# Patient Record
Sex: Male | Born: 1960 | Race: White | Hispanic: No | Marital: Married | Smoking: Never smoker
Health system: Southern US, Community
[De-identification: ages and names within clinical notes are randomized; demographics above are authoritative.]

## PROBLEM LIST (undated history)

## (undated) DIAGNOSIS — S63599A Other specified sprain of unspecified wrist, initial encounter: Secondary | ICD-10-CM

## (undated) DIAGNOSIS — E785 Hyperlipidemia, unspecified: Secondary | ICD-10-CM

## (undated) DIAGNOSIS — R7303 Prediabetes: Secondary | ICD-10-CM

## (undated) DIAGNOSIS — M545 Low back pain, unspecified: Secondary | ICD-10-CM

## (undated) DIAGNOSIS — K8020 Calculus of gallbladder without cholecystitis without obstruction: Secondary | ICD-10-CM

## (undated) DIAGNOSIS — M1990 Unspecified osteoarthritis, unspecified site: Secondary | ICD-10-CM

## (undated) DIAGNOSIS — C4491 Basal cell carcinoma of skin, unspecified: Secondary | ICD-10-CM

## (undated) DIAGNOSIS — M25511 Pain in right shoulder: Secondary | ICD-10-CM

## (undated) DIAGNOSIS — D126 Benign neoplasm of colon, unspecified: Secondary | ICD-10-CM

## (undated) DIAGNOSIS — M25512 Pain in left shoulder: Secondary | ICD-10-CM

## (undated) DIAGNOSIS — M65831 Other synovitis and tenosynovitis, right forearm: Secondary | ICD-10-CM

## (undated) DIAGNOSIS — N182 Chronic kidney disease, stage 2 (mild): Secondary | ICD-10-CM

## (undated) DIAGNOSIS — K5790 Diverticulosis of intestine, part unspecified, without perforation or abscess without bleeding: Secondary | ICD-10-CM

## (undated) HISTORY — DX: Low back pain, unspecified: M54.50

## (undated) HISTORY — DX: Benign neoplasm of colon, unspecified: D12.6

## (undated) HISTORY — DX: Basal cell carcinoma of skin, unspecified: C44.91

## (undated) HISTORY — DX: Unspecified osteoarthritis, unspecified site: M19.90

## (undated) HISTORY — DX: Pain in right shoulder: M25.511

## (undated) HISTORY — PX: OTHER SURGICAL HISTORY: SHX169

## (undated) HISTORY — DX: Hyperlipidemia, unspecified: E78.5

## (undated) HISTORY — DX: Low back pain: M54.5

## (undated) HISTORY — DX: Diverticulosis of intestine, part unspecified, without perforation or abscess without bleeding: K57.90

## (undated) HISTORY — DX: Pain in left shoulder: M25.512

## (undated) HISTORY — DX: Other specified sprain of unspecified wrist, initial encounter: S63.599A

## (undated) HISTORY — PX: BASAL CELL CARCINOMA EXCISION: SHX1214

## (undated) HISTORY — PX: SPINE SURGERY: SHX786

## (undated) HISTORY — DX: Chronic kidney disease, stage 2 (mild): N18.2

## (undated) HISTORY — DX: Calculus of gallbladder without cholecystitis without obstruction: K80.20

## (undated) HISTORY — DX: Other synovitis and tenosynovitis, right forearm: M65.831

## (undated) HISTORY — DX: Prediabetes: R73.03

## (undated) HISTORY — PX: COLONOSCOPY W/ POLYPECTOMY: SHX1380

---

## 2008-06-13 ENCOUNTER — Encounter: Payer: Self-pay | Admitting: Family Medicine

## 2009-08-25 ENCOUNTER — Encounter: Payer: Self-pay | Admitting: Family Medicine

## 2009-08-26 ENCOUNTER — Encounter: Payer: Self-pay | Admitting: Family Medicine

## 2009-10-28 ENCOUNTER — Encounter: Payer: Self-pay | Admitting: Family Medicine

## 2009-10-28 LAB — HM COLONOSCOPY

## 2009-12-02 ENCOUNTER — Encounter: Admission: RE | Admit: 2009-12-02 | Discharge: 2009-12-02 | Payer: Self-pay | Admitting: Orthopedic Surgery

## 2009-12-25 ENCOUNTER — Encounter: Admission: RE | Admit: 2009-12-25 | Discharge: 2009-12-25 | Payer: Self-pay | Admitting: Orthopedic Surgery

## 2010-03-04 HISTORY — PX: LUMBAR DISC SURGERY: SHX700

## 2010-04-04 DIAGNOSIS — E785 Hyperlipidemia, unspecified: Secondary | ICD-10-CM

## 2010-04-04 HISTORY — DX: Hyperlipidemia, unspecified: E78.5

## 2010-04-26 ENCOUNTER — Encounter: Payer: Self-pay | Admitting: Family Medicine

## 2010-05-06 ENCOUNTER — Ambulatory Visit: Payer: Self-pay | Admitting: Family Medicine

## 2010-05-10 ENCOUNTER — Encounter: Payer: Self-pay | Admitting: Family Medicine

## 2010-05-11 ENCOUNTER — Encounter: Payer: Self-pay | Admitting: Family Medicine

## 2010-05-20 NOTE — Miscellaneous (Signed)
  Clinical Lists Changes  Observations: Added new observation of SOCIAL HX: Occupaton: regional Advice worker for [employer] (05/11/2010 12:00) Added new observation of PAST SURG HX: L4-5 discectomy 2011 Skin: basal cell removed (05/11/2010 12:00) Added new observation of PAST MED HX: Low back pain Hyperlipidemia Basal cell carcinoma (05/11/2010 12:00)       Past History:  Past Medical History: Low back pain Hyperlipidemia Basal cell carcinoma  Past Surgical History: L4-5 discectomy 2011 Skin: basal cell removed   Social History: Occupaton: Transport planner for [employer]

## 2010-05-28 ENCOUNTER — Encounter: Payer: Self-pay | Admitting: Family Medicine

## 2010-05-28 ENCOUNTER — Telehealth: Payer: Self-pay | Admitting: Family Medicine

## 2010-05-28 ENCOUNTER — Ambulatory Visit (INDEPENDENT_AMBULATORY_CARE_PROVIDER_SITE_OTHER): Payer: Self-pay | Admitting: Family Medicine

## 2010-05-28 DIAGNOSIS — E785 Hyperlipidemia, unspecified: Secondary | ICD-10-CM | POA: Insufficient documentation

## 2010-05-28 DIAGNOSIS — Z23 Encounter for immunization: Secondary | ICD-10-CM

## 2010-05-31 ENCOUNTER — Encounter: Payer: Self-pay | Admitting: Family Medicine

## 2010-06-01 NOTE — Assessment & Plan Note (Signed)
Summary: cpx new patient/vfw   Vital Signs:  Patient profile:   50 year old male Height:      74.5 inches Weight:      212.25 pounds BMI:     26.98 O2 Sat:      97 % on Room air Pulse rate:   64 / minute BP sitting:   118 / 79  (right arm) Cuff size:   large  Vitals Entered By: Francee Piccolo CMA Duncan Dull) (May 28, 2010 1:21 PM)  O2 Flow:  Room air CC: Establish care, review records///SP Is Patient Diabetic? No   History of Present Illness: 50 y/o WM here to establish care. No acute complaints.  He gets annual CPE's with EKG, hemoccults, fasting lab panel, ext. with his employer, [employer]. His most recent labs showed mild hyperlipidemia (Tchol 240, LDL 157, HDL 49, trig 172).  This LDL was up 27 points from 0981 labs.  Of note, in 2010 he was suffering from low back pain and was not able to swim like he usually does.  He then had lumbar spine surgery in 03/2009 and has just now started swimming again.  Diet in the last year or so has also had lots more red meat than prior years. Denies CP, SOB, palpitations. Care Coordination Dermatologist: Dr. Donzetta Starch Gastroenterologist: Dr. Loreta Ave Neurosurgeon: Dr. Jeral Fruit [facility])  Current Medications (verified): 1)  Multivitamins  Tabs (Multiple Vitamin) .... Take 1 Tablet By Mouth Once A Day 2)  Co Q-10 100 Mg Caps (Coenzyme Q10) .... Take 1 Capsule By Mouth Once A Day 3)  Red Yeast Rice 600 Mg Tabs (Red Yeast Rice Extract) .... Take 1 Tablet By Mouth Once A Day  Allergies (verified): No Known Drug Allergies  Past History:  Past Medical History: Low back pain Hyperlipidemia Basal cell carcinoma Colon polyps (benign): repeat planned for 2016   Past Surgical History: L4-5 discectomy 03/2010 Skin: basal cell removed  Family History: Dad: hyperlipidemia (no CAD). Mom: d. age 63 of colon cancer. 3 sisters-healthy 1 sister d. age 36 yrs of kidney failure.  Social History: Married, 3 grown children. Occupaton: Conservator, museum/gallery for [employer]. No T/A/Ds.   Competetive swimmer for many years, still swims as his primary form of exercise.   Originally from [place] area, big [employer].  Review of Systems  The patient denies fever, weight loss, weight gain, vision loss, decreased hearing, hoarseness, chest pain, syncope, dyspnea on exertion, peripheral edema, prolonged cough, headaches, hemoptysis, abdominal pain, melena, hematochezia, severe indigestion/heartburn, hematuria, incontinence, genital sores, muscle weakness, suspicious skin lesions, transient blindness, difficulty walking, depression, unusual weight change, abnormal bleeding, enlarged lymph nodes, angioedema, breast masses, and testicular masses.    Physical Exam  General:  VS noted, all normal.  Pleasant, well appearing. No further exam today.   Impression & Recommendations:  Problem # 1:  HYPERLIPIDEMIA (ICD-272.4) Assessment New Spent 20 minutes with patient today, with >50% of this time spent in education and care coordination regarding hyperlipidemia. Goal LDL 130.   He is restarting his prior lifestyle of frequent swimming and lower fat/chol intake and he wants to avoid med if possible. Will recheck chol panel in 6 mo to see if lifestyle changes alone result in enough chol reduction. Tdap given today.  Recommend routine Td in 10 yrs. He wants to get flu vaccine through his employer. We'll get records from his GI MD. He gets annual CPE, etc through his employer so I told him to come see me whenever he  has questions or problems.  Complete Medication List: 1)  Multivitamins Tabs (Multiple vitamin) .... Take 1 tablet by mouth once a day 2)  Co Q-10 100 Mg Caps (Coenzyme q10) .... Take 1 capsule by mouth once a day 3)  Red Yeast Rice 600 Mg Tabs (Red yeast rice extract) .... Take 1 tablet by mouth once a day  Patient Instructions: 1)  Return in 60mo for labs only (fasting lipid panel, dx is 272.4). 2)  Return for office visit  with me at any time for problems.   Orders Added: 1)  New Patient Level II [99202]   Immunizations Administered:  Tetanus Vaccine:    Vaccine Type: Tdap    Site: left deltoid    Mfr: GlaxoSmithKline    Dose: 0.5 ml    Route: IM    Given by: Francee Piccolo CMA (AAMA)    Exp. Date: 01/22/2012    Lot #: [id number]    VIS given: 02/20/08 version given May 28, 2010.

## 2010-06-10 NOTE — Progress Notes (Signed)
Summary: Release of medical records letter and form mailed  Phone Note Other Incoming   Summary of Call: Pls request records from Dr. Loreta Ave (GI).  Thx Initial call taken by: Michell Heinrich M.D.,  May 28, 2010 2:10 PM  Follow-up for Phone Call        Mailed release of information form and letter asking patient to fill out and return.  Follow-up by: Georga Bora,  June 01, 2010 1:09 PM

## 2010-06-10 NOTE — Miscellaneous (Signed)
  Clinical Lists Changes  Observations: Added new observation of GASTROENT MD: Dr. Charna Elizabeth (05/31/2010 9:24) Added new observation of PAST MED HX: Low back pain Hyperlipidemia Basal cell carcinoma Colon polyps (adenomatous) and diverticulosis: repeat planned for 2016  (05/31/2010 9:24) Added new observation of PRIMARY MD: Nicoletta Ba, MD (05/31/2010 9:24) Added new observation of COLONOSCOPY: Adenomatous Polyp (10/28/2009 9:26)        Preventive Care Screening  Colonoscopy:    Date:  10/28/2009    Results:  Adenomatous Polyp   Past History:  Past Medical History: Low back pain Hyperlipidemia Basal cell carcinoma Colon polyps (adenomatous) and diverticulosis: repeat planned for 2016    Care Coordination Gastroenterologist: Dr. Charna Elizabeth

## 2010-06-15 NOTE — Procedures (Signed)
Summary: Colonoscopy Report/[facility]  Colonoscopy Report/[facility]   Imported By: Maryln Gottron 06/07/2010 13:21:39  _____________________________________________________________________  External Attachment:    Type:   Image     Comment:   External Document

## 2010-06-15 NOTE — Letter (Signed)
Summary: [facility]  [facility]   Imported By: Maryln Gottron 06/07/2010 13:24:49  _____________________________________________________________________  External Attachment:    Type:   Image     Comment:   External Document

## 2010-06-15 NOTE — Letter (Signed)
Summary: Health Services Physical/[facility]  Health Services Physical/[facility]   Imported By: Maryln Gottron 06/07/2010 13:16:43  _____________________________________________________________________  External Attachment:    Type:   Image     Comment:   External Document

## 2010-06-25 ENCOUNTER — Ambulatory Visit: Payer: Self-pay | Admitting: Family Medicine

## 2010-08-13 ENCOUNTER — Encounter: Payer: Self-pay | Admitting: Family Medicine

## 2010-10-04 ENCOUNTER — Encounter: Payer: Self-pay | Admitting: Family Medicine

## 2010-10-04 ENCOUNTER — Other Ambulatory Visit (INDEPENDENT_AMBULATORY_CARE_PROVIDER_SITE_OTHER): Payer: BC Managed Care – PPO

## 2010-10-04 ENCOUNTER — Ambulatory Visit: Payer: BC Managed Care – PPO | Admitting: Family Medicine

## 2010-10-04 DIAGNOSIS — E785 Hyperlipidemia, unspecified: Secondary | ICD-10-CM

## 2010-10-04 LAB — LIPID PANEL
Total CHOL/HDL Ratio: 4
Triglycerides: 93 mg/dL (ref 0.0–149.0)
VLDL: 18.6 mg/dL (ref 0.0–40.0)

## 2012-02-20 ENCOUNTER — Encounter: Payer: Self-pay | Admitting: Family Medicine

## 2012-02-20 ENCOUNTER — Ambulatory Visit (INDEPENDENT_AMBULATORY_CARE_PROVIDER_SITE_OTHER): Payer: BC Managed Care – PPO | Admitting: Family Medicine

## 2012-02-20 VITALS — BP 110/66 | HR 53 | Ht 74.5 in | Wt 211.0 lb

## 2012-02-20 DIAGNOSIS — E785 Hyperlipidemia, unspecified: Secondary | ICD-10-CM

## 2012-02-20 MED ORDER — ATORVASTATIN CALCIUM 10 MG PO TABS: 10.0000 mg | ORAL_TABLET | Freq: Every day | ORAL | Status: DC

## 2012-02-20 NOTE — Progress Notes (Signed)
OFFICE NOTE  02/20/2012  CC:  Chief Complaint  Patient presents with  . Follow-up    hyperlipidemia; elevated labs on work physical     HPI: Patient is a 51 y.o. Caucasian male who is here for discussion of recent blood tests he had via his employer: showed elevated Tchol and LDL as well as elevated LDL-P and elevated small LDL-P.   Serum chemistries were normal except mildly elevated potassium (5.3).  Thyroid panel normal, CBC w/diff normal, UA normal. He is very active-swims 4 days per week and also eats an excellent low fat/low chol diet. He is moving to [place] for 3 yrs for his employer and is interested in discussing how to get started on treatment for his high cholesterol and how to do f/u since he is moving out of the country.  He'll be moving in the first week of January 2014. Currently has no physical complaints.   Pertinent PMH:  Past Medical History  Diagnosis Date  . Low back pain   . Hyperlipidemia 2012    Diet/exercise x 33mo improved this.  . Basal cell carcinoma   . Adenomatous colon polyp   . Diverticulosis    Past Surgical History  Procedure Date  . Lumbar disc surgery 03/2010    L4-5  . Basal cell carcinoma excision     MEDS:  Outpatient Prescriptions Prior to Visit  Medication Sig Dispense Refill  . Coenzyme Q10 (CO Q 10) 100 MG CAPS Take 1 capsule by mouth daily.        . Multiple Vitamin (MULTIVITAMIN) tablet Take 1 tablet by mouth daily.        . Red Yeast Rice 600 MG TABS Take 1 tablet by mouth daily.         Last reviewed on 02/20/2012  3:54 PM by Jeoffrey Massed, MD  PE: Blood pressure 110/66, pulse 53, height 6' 2.5" (1.892 m), weight 211 lb (95.709 kg). Gen: Alert, well appearing.  Patient is oriented to person, place, time, and situation. AFFECT: pleasant, lucid thought and speech. CV: RRR, no m/r/g.   LUNGS: CTA bilat, nonlabored resps, good aeration in all lung fields. ABD: soft, NT, ND, BS normal.  No hepatospenomegaly or mass.   No bruits. EXT: no clubbing, cyanosis, or edema.   LAB: per [facility] on 01/31/12: Tchol 245, LDL-C 170, HDL-C 55, trigs 100, LDL particle number 1926, small LDL-P 1093, LDL size 20.3, and HDL-P 32.  IMPRESSION AND PLAN:  HYPERLIPIDEMIA He is overall a low risk CV profile, but his lipid numbers and advanced lipoprotein particle testing is high enough that I would recommend he start a statin and low dose aspirin ( 81mg  qd). Discussed in detail with pt today and decided to start atorvastatin 10mg  qd.  Due to his moving to [place] in about 6 wks, I wrote down the labs for him to have repeated in 50mo on a paper rx today and gave it to him (FLP, hepatic panel, LDL-P number and size, small LDL-P number, and HDL particle number).  He'll have labwork availability through [place] while in [place], and I encouraged him to go ahead and find a primary care MD there ASAP so he can be followed in the office periodically. Therapeutic expectations and side effect profile of medication discussed today.  Patient's questions answered. He'll be staying in [place] a minimum of 3 yrs but may be returning here annually for CPE with me.    An After Visit Summary was printed and given  to the patient.

## 2012-02-20 NOTE — Assessment & Plan Note (Signed)
He is overall a low risk CV profile, but his lipid numbers and advanced lipoprotein particle testing is high enough that I would recommend he start a statin and low dose aspirin ( 81mg  qd). Discussed in detail with pt today and decided to start atorvastatin 10mg  qd.  Due to his moving to [place] in about 6 wks, I wrote down the labs for him to have repeated in 21mo on a paper rx today and gave it to him (FLP, hepatic panel, LDL-P number and size, small LDL-P number, and HDL particle number).  He'll have labwork availability through [place] while in [place], and I encouraged him to go ahead and find a primary care MD there ASAP so he can be followed in the office periodically. Therapeutic expectations and side effect profile of medication discussed today.  Patient's questions answered. He'll be staying in [place] a minimum of 3 yrs but may be returning here annually for CPE with me.

## 2012-10-12 ENCOUNTER — Telehealth: Payer: Self-pay | Admitting: Emergency Medicine

## 2012-10-12 MED ORDER — ATORVASTATIN CALCIUM 10 MG PO TABS: 10.0000 mg | ORAL_TABLET | Freq: Every day | ORAL | Status: DC

## 2012-10-12 NOTE — Telephone Encounter (Signed)
Patient needs a refill on Atovastation 10 mg, he has relocated out of the country but is [place] the states right now. He has an [employer] cell phone [phone] that he can be reached at. He will not be back until November and has made an appointment for follow up on medication. He would like to see if he can get enough medication until he comes back. [facility] [place] [facility] number is [phone] this is where he would like the prescription sent.

## 2012-10-12 NOTE — Telephone Encounter (Signed)
Medications [facility] [place]  Thomas Hubbard is contacting patient to let him know that he will not get any refills from [place] until he is seen in the office.

## 2012-10-12 NOTE — Telephone Encounter (Signed)
OK to RF until the time of his next appt in November. No RF's beyond this point--make sure he knows that.-thx

## 2012-10-12 NOTE — Telephone Encounter (Signed)
Last OV was 02/20/12, follow up schedule 02/18/13.  No follow up noted in chart that I saw.  Please advise refill.

## 2013-02-18 ENCOUNTER — Ambulatory Visit: Payer: BC Managed Care – PPO | Admitting: Family Medicine

## 2013-02-25 ENCOUNTER — Ambulatory Visit: Payer: BC Managed Care – PPO | Admitting: Family Medicine

## 2013-02-26 ENCOUNTER — Encounter: Payer: Self-pay | Admitting: Family Medicine

## 2013-02-26 ENCOUNTER — Ambulatory Visit (INDEPENDENT_AMBULATORY_CARE_PROVIDER_SITE_OTHER): Payer: 59 | Admitting: Family Medicine

## 2013-02-26 VITALS — BP 112/76 | HR 60 | Temp 97.8°F | Resp 18 | Ht 74.5 in | Wt 205.0 lb

## 2013-02-26 DIAGNOSIS — E785 Hyperlipidemia, unspecified: Secondary | ICD-10-CM

## 2013-02-26 MED ORDER — ATORVASTATIN CALCIUM 10 MG PO TABS: 10.0000 mg | ORAL_TABLET | Freq: Every day | ORAL | Status: DC

## 2013-02-26 NOTE — Progress Notes (Signed)
Pre visit review using our clinic review tool, if applicable. No additional management support is needed unless otherwise documented below in the visit note.  OFFICE NOTE  02/26/2013  CC:  Chief Complaint  Patient presents with  . Follow-up     HPI: Patient is a 52 y.o. Caucasian male who is here for f/u hyperlipidemia. I started him on atorv about a year ago and he has been taking it but has been living in [place]. He reports that he feels well and is having no problems taking his atorv daily. He says he got a CPE with routine blood testing in March this year (in [place]) and he says all labs were normal.  He does not have any records with him today.  Pertinent PMH:  Past Medical History  Diagnosis Date  . Low back pain   . Hyperlipidemia 2012    Diet/exercise x 87mo improved this.  . Basal cell carcinoma   . Adenomatous colon polyp   . Diverticulosis    Past Surgical History  Procedure Laterality Date  . Lumbar disc surgery  03/2010    L4-5  . Basal cell carcinoma excision       MEDS:  Outpatient Prescriptions Prior to Visit  Medication Sig Dispense Refill  . atorvastatin (LIPITOR) 10 MG tablet Take 1 tablet (10 mg total) by mouth daily.  90 tablet  1  . Coenzyme Q10 (CO Q 10) 100 MG CAPS Take 1 capsule by mouth daily.        . Multiple Vitamin (MULTIVITAMIN) tablet Take 1 tablet by mouth daily.        Marland Kitchen NAFTIN 2 % CREA       . Red Yeast Rice 600 MG TABS Take 1 tablet by mouth daily.         No facility-administered medications prior to visit.    PE: Blood pressure 112/76, pulse 60, temperature 97.8 F (36.6 C), temperature source Temporal, resp. rate 18, height 6' 2.5" (1.892 m), weight 205 lb (92.987 kg), SpO2 98.00%. Gen: Alert, well appearing.  Patient is oriented to person, place, time, and situation. AFFECT: pleasant, lucid thought and speech. No further exam today.  IMPRESSION AND PLAN:  HYPERLIPIDEMIA Stable. Tolerating statin fine. He  will try to get me a copy of his labs that were done in [place] in March this year. I told him annual lipid panel with liver panel are recommended.  He is not due for these today so no blood testing was done today. He'll be returning to [place] tonight and will come back for a return visit in 1 yr. He has plans to live in [place] for the next 2 yrs, then move back to [facility].   An After Visit Summary was printed and given to the patient.  FOLLOW UP: 1 yr

## 2013-02-26 NOTE — Assessment & Plan Note (Signed)
Stable. Tolerating statin fine. He will try to get me a copy of his labs that were done in [place] in March this year. I told him annual lipid panel with liver panel are recommended.  He is not due for these today so no blood testing was done today. He'll be returning to [place] tonight and will come back for a return visit in 1 yr. He has plans to live in [place] for the next 2 yrs, then move back to [facility].

## 2014-03-25 ENCOUNTER — Encounter: Payer: Self-pay | Admitting: Family Medicine

## 2014-03-25 ENCOUNTER — Ambulatory Visit (INDEPENDENT_AMBULATORY_CARE_PROVIDER_SITE_OTHER): Payer: 59 | Admitting: Family Medicine

## 2014-03-25 VITALS — BP 143/89 | HR 50 | Temp 97.7°F | Resp 18 | Ht 74.5 in | Wt 208.0 lb

## 2014-03-25 DIAGNOSIS — E785 Hyperlipidemia, unspecified: Secondary | ICD-10-CM

## 2014-03-25 LAB — COMPREHENSIVE METABOLIC PANEL
ALT: 17 U/L (ref 0–53)
AST: 18 U/L (ref 0–37)
Albumin: 4 g/dL (ref 3.5–5.2)
Alkaline Phosphatase: 51 U/L (ref 39–117)
BILIRUBIN TOTAL: 1.2 mg/dL (ref 0.2–1.2)
BUN: 18 mg/dL (ref 6–23)
CO2: 26 mEq/L (ref 19–32)
CREATININE: 1.1 mg/dL (ref 0.4–1.5)
Calcium: 9.1 mg/dL (ref 8.4–10.5)
Chloride: 106 mEq/L (ref 96–112)
GFR: 73.13 mL/min (ref >60.00)
GLUCOSE: 101 mg/dL — AB (ref 70–99)
Potassium: 4.3 mEq/L (ref 3.5–5.1)
Sodium: 139 mEq/L (ref 135–145)
Total Protein: 6.7 g/dL (ref 6.0–8.3)

## 2014-03-25 LAB — LIPID PANEL
Cholesterol: 162 mg/dL (ref 0–200)
HDL: 45.4 mg/dL (ref >39.00)
LDL CALC: 106 mg/dL — AB (ref 0–99)
NONHDL: 116.6
Total CHOL/HDL Ratio: 4
Triglycerides: 54 mg/dL (ref 0.0–149.0)
VLDL: 10.8 mg/dL (ref 0.0–40.0)

## 2014-03-25 MED ORDER — ATORVASTATIN CALCIUM 10 MG PO TABS: 10.0000 mg | ORAL_TABLET | Freq: Every day | ORAL | Status: DC

## 2014-03-25 NOTE — Addendum Note (Signed)
Addended by: Tammi Sou on: 03/25/2014 08:29 AM   Modules accepted: Orders

## 2014-03-25 NOTE — Progress Notes (Signed)
OFFICE NOTE  03/25/2014  CC:  Chief Complaint  Patient presents with  . Hyperlipidemia    fasting   HPI: Patient is a 53 y.o. Caucasian male who is here for 1 yr f/u hyperlipidemia.  Taking atorva w/out problem. Living in [place] still, doing a lot of business travel, not as much exercise as usual. Dies not as good as usual. No acute complaints.   Pertinent PMH:  Past medical, surgical, social, and family history reviewed and no changes are noted since last office visit.  MEDS:  Outpatient Prescriptions Prior to Visit  Medication Sig Dispense Refill  . atorvastatin (LIPITOR) 10 MG tablet Take 1 tablet (10 mg total) by mouth daily. 90 tablet 4  . Coenzyme Q10 (CO Q 10) 100 MG CAPS Take 1 capsule by mouth daily.      . Multiple Vitamin (MULTIVITAMIN) tablet Take 1 tablet by mouth daily.      Marland Kitchen NAFTIN 2 % CREA     . Red Yeast Rice 600 MG TABS Take 1 tablet by mouth daily.       No facility-administered medications prior to visit.    PE: Blood pressure 143/89, pulse 50, temperature 97.7 F (36.5 C), temperature source Temporal, resp. rate 18, height 6' 2.5" (1.892 m), weight 208 lb (94.348 kg), SpO2 98 %. Gen: Alert, well appearing.  Patient is oriented to person, place, time, and situation. No further exam today.  IMPRESSION AND PLAN: Hyperlipidemia: recheck FLP and CMET today. He'll be moving back to [place] permanently in the near future so he'll likely make appt for CPE pretty soon after. He is UTD on flu vaccine.  An After Visit Summary was printed and given to the patient.  FOLLOW UP: prn

## 2014-03-25 NOTE — Progress Notes (Signed)
Pre visit review using our clinic review tool, if applicable. No additional management support is needed unless otherwise documented below in the visit note. 

## 2014-03-26 ENCOUNTER — Encounter: Payer: Self-pay | Admitting: Family Medicine

## 2015-03-26 ENCOUNTER — Ambulatory Visit: Payer: 59 | Admitting: Family Medicine

## 2015-03-31 ENCOUNTER — Encounter: Payer: Self-pay | Admitting: Family Medicine

## 2015-03-31 ENCOUNTER — Ambulatory Visit (INDEPENDENT_AMBULATORY_CARE_PROVIDER_SITE_OTHER): Payer: 59 | Admitting: Family Medicine

## 2015-03-31 VITALS — BP 117/76 | HR 48 | Temp 97.4°F | Resp 16 | Ht 74.5 in | Wt 212.5 lb

## 2015-03-31 DIAGNOSIS — E785 Hyperlipidemia, unspecified: Secondary | ICD-10-CM

## 2015-03-31 LAB — ALT: ALT: 17 U/L (ref 0–53)

## 2015-03-31 LAB — LIPID PANEL
CHOLESTEROL: 167 mg/dL (ref 0–200)
HDL: 48.1 mg/dL (ref >39.00)
LDL Cholesterol: 101 mg/dL — ABNORMAL HIGH (ref 0–99)
NonHDL: 118.99
TRIGLYCERIDES: 88 mg/dL (ref 0.0–149.0)
Total CHOL/HDL Ratio: 3
VLDL: 17.6 mg/dL (ref 0.0–40.0)

## 2015-03-31 LAB — AST: AST: 18 U/L (ref 0–37)

## 2015-03-31 MED ORDER — ATORVASTATIN CALCIUM 10 MG PO TABS: 10.0000 mg | ORAL_TABLET | Freq: Every day | ORAL | Status: DC

## 2015-03-31 NOTE — Progress Notes (Signed)
OFFICE VISIT  03/31/2015   CC:  Chief Complaint  Patient presents with  . Follow-up    HCL, pt is fasting.    HPI:    Patient is a 54 y.o. Caucasian male who presents for 1 yr f/u hyperlipidemia.  Will be living in [place] for one more year then will be living back in [place] permanently.  Tolerating statin fine, compliant with med.  Does his best as far as low carb/low fat diet. Has occ glass of red wine.  Exercise: swims and runs, lots of walking.      Past Medical History  Diagnosis Date  . Low back pain   . Hyperlipidemia 2012    Diet/exercise x 4mo improved this.  . Basal cell carcinoma   . Adenomatous colon polyp 2011  . Diverticulosis     Past Surgical History  Procedure Laterality Date  . Lumbar disc surgery  03/2010    L4-5  . Basal cell carcinoma excision    . Colonoscopy w/ polypectomy  10/2009    1 polyp, recall 5 yrs    Outpatient Prescriptions Prior to Visit  Medication Sig Dispense Refill  . atorvastatin (LIPITOR) 10 MG tablet Take 1 tablet (10 mg total) by mouth daily. 90 tablet 4   No facility-administered medications prior to visit.    No Known Allergies  ROS As per HPI  PE: Blood pressure 117/76, pulse 48, temperature 97.4 F (36.3 C), temperature source Oral, resp. rate 16, height 6' 2.5" (1.892 m), weight 212 lb 8 oz (96.389 kg), SpO2 98 %. Gen: Alert, well appearing.  Patient is oriented to person, place, time, and situation. CV: RRR, no m/r/g.   LUNGS: CTA bilat, nonlabored resps, good aeration in all lung fields. EXT: no clubbing, cyanosis, or edema.    LABS:  Lab Results  Component Value Date   CHOL 162 03/25/2014   HDL 45.40 03/25/2014   LDLCALC 106* 03/25/2014   TRIG 54.0 03/25/2014   CHOLHDL 4 03/25/2014    IMPRESSION AND PLAN:  Hyperlipidemia: The current medical regimen is effective;  continue present plan and medications. Recheck FLP and AST/ALT today.  Refilled meds. He is still living in [place] for  the next 1 yr, then will be living permanently back in [place].  An After Visit Summary was printed and given to the patient.  FOLLOW UP: Return in about 1 year (around 03/30/2016) for annual CPE (fasting).

## 2015-03-31 NOTE — Progress Notes (Signed)
Pre visit review using our clinic review tool, if applicable. No additional management support is needed unless otherwise documented below in the visit note. 

## 2015-04-03 ENCOUNTER — Encounter: Payer: Self-pay | Admitting: Family Medicine

## 2015-06-23 ENCOUNTER — Other Ambulatory Visit: Payer: Self-pay | Admitting: *Deleted

## 2015-06-23 MED ORDER — ATORVASTATIN CALCIUM 10 MG PO TABS: 10.0000 mg | ORAL_TABLET | Freq: Every day | ORAL | Status: DC

## 2015-06-23 NOTE — Telephone Encounter (Signed)
RF request for atorvastatin LOV: 03/31/15 Next ov: 03/30/16 Last written: 03/31/15 #90 w/ 4RF

## 2016-02-03 DIAGNOSIS — M25512 Pain in left shoulder: Secondary | ICD-10-CM

## 2016-02-03 HISTORY — DX: Pain in left shoulder: M25.512

## 2016-02-04 ENCOUNTER — Encounter: Payer: Self-pay | Admitting: Family Medicine

## 2016-02-04 ENCOUNTER — Ambulatory Visit (INDEPENDENT_AMBULATORY_CARE_PROVIDER_SITE_OTHER): Payer: 59 | Admitting: Family Medicine

## 2016-02-04 VITALS — BP 132/88 | HR 52 | Temp 98.6°F | Resp 16 | Wt 219.1 lb

## 2016-02-04 DIAGNOSIS — G8929 Other chronic pain: Secondary | ICD-10-CM

## 2016-02-04 DIAGNOSIS — M791 Myalgia: Secondary | ICD-10-CM

## 2016-02-04 DIAGNOSIS — M25512 Pain in left shoulder: Secondary | ICD-10-CM

## 2016-02-04 DIAGNOSIS — M7918 Myalgia, other site: Secondary | ICD-10-CM

## 2016-02-04 NOTE — Progress Notes (Signed)
OFFICE VISIT  02/04/2016   CC:  Chief Complaint  Patient presents with  . Shoulder Pain    Left shoulder and upper arm pain   HPI:    Patient is a 55 y.o. Caucasian male who presents for left shoulder and left upper arm pain. He swims for exercise, noted that the pain started about 2 mo ago.  He used to do all 4 strokes but cannot do butterfly or breast stroke now. He feels the pain in left deltoid, primarly posterior aspect.  Hurts if he sleeps on it and with aBduction and ER of left shoulder.  No radicular pain or paresthesias.   Aleve 1-2 tabs per day and this helps a little bit at most.  No ice or heat.  No hx of L shoulder prob in past. He was formerly a Academic librarian competitively in college.  Past Medical History:  Diagnosis Date  . Adenomatous colon polyp 2011; 03/2015  . Basal cell carcinoma   . Diverticulosis   . Hyperlipidemia 2012   Diet/exercise x 24mo improved this.  . Low back pain     Past Surgical History:  Procedure Laterality Date  . BASAL CELL CARCINOMA EXCISION    . COLONOSCOPY W/ POLYPECTOMY  10/2009; 03/2015   2011 had 1 polyp, 2016 had 2 polyps: recall 5 yrs  . LUMBAR DISC SURGERY  03/2010   L4-5    Outpatient Medications Prior to Visit  Medication Sig Dispense Refill  . atorvastatin (LIPITOR) 10 MG tablet Take 1 tablet (10 mg total) by mouth daily. 90 tablet 3   No facility-administered medications prior to visit.     No Known Allergies  ROS As per HPI  PE: Blood pressure 132/88, pulse (!) 52, temperature 98.6 F (37 C), temperature source Temporal, resp. rate 16, weight 219 lb 1.9 oz (99.4 kg), SpO2 96 %. Gen: Alert, well appearing.  Patient is oriented to person, place, time, and situation. AFFECT: pleasant, lucid thought and speech. Left shoulder: no tenderness of shoulder girdle.  He has point tenderness in posterior aspect of left deltoid muscle in proximal to mid aspect.  Pain with abduction when he gets to 30+ deg and this lasts until full  ROM.  Resisted IR is painful on left but ER is fine.  Speed's and Yergason's negative.    LABS:  none  IMPRESSION AND PLAN:  Left deltoid pain/tenderness. He does have a bit of RC pain with aBduction and IR, but I am not convinced he has RC pathology at this time. Will have him continue aleve 200-400 mg bid as he is currently doing and will ask sports medicine to see him for further evaluation.  An After Visit Summary was printed and given to the patient.  FOLLOW UP: Return for as needed.  Signed:  Crissie Sickles, MD           02/04/2016

## 2016-02-04 NOTE — Progress Notes (Signed)
Pre visit review using our clinic review tool, if applicable. No additional management support is needed unless otherwise documented below in the visit note. 

## 2016-02-08 ENCOUNTER — Encounter: Payer: Self-pay | Admitting: Family Medicine

## 2016-02-08 ENCOUNTER — Ambulatory Visit (INDEPENDENT_AMBULATORY_CARE_PROVIDER_SITE_OTHER): Payer: 59 | Admitting: Family Medicine

## 2016-02-08 DIAGNOSIS — M25512 Pain in left shoulder: Secondary | ICD-10-CM | POA: Diagnosis not present

## 2016-02-08 MED ORDER — DICLOFENAC SODIUM 75 MG PO TBEC: 75.0000 mg | DELAYED_RELEASE_TABLET | Freq: Two times a day (BID) | ORAL | 1 refills | Status: DC

## 2016-02-08 MED FILL — DICLOFENAC SOD 75 MG TAB EC: 75 | 30 days supply | Qty: 60 | Fill #0

## 2016-02-08 NOTE — Patient Instructions (Signed)
You have subacromial bursitis - often a calcific form of this bursitis will present this way though they're treated the same. Try to avoid painful activities (overhead activities, lifting with extended arm) as much as possible. Diclofenac 75mg  twice a day with food for pain and inflammation - typically take for 7-10 days regularly then as needed beyond this. Can take tylenol in addition to this. Subacromial injection may be beneficial to help with pain and to decrease inflammation. Start physical therapy with transition to home exercise program. Do home exercise program with theraband and scapular stabilization exercises daily - these are very important for long term relief. If not improving at follow-up we will consider imaging, injection, and/or nitro patches. Follow up with me in 5-6 weeks.

## 2016-02-09 DIAGNOSIS — M25512 Pain in left shoulder: Secondary | ICD-10-CM | POA: Insufficient documentation

## 2016-02-09 NOTE — Assessment & Plan Note (Signed)
patient's lack of abduction, positive impingement testing suggest bursitis - often calcific subacromial bursitis will present in this manner with good strength, lack of injury but relatively abrupt decrease in motion.  We discussed options - he would like to go ahead with physical therapy, diclofenac.  F/u in 5-6 weeks.  Consider imaging (u/s), injection, nitro patches if not improving as expected.  F/u in 5-6 weeks.

## 2016-02-09 NOTE — Progress Notes (Signed)
PCP and consultation requested by: Thomas Sou, MD  Subjective:   HPI: Patient is a 55 y.o. male here for left shoulder pain.  Patient denies known injury. He reports about 2 months ago when swimming he felt a pulling sensation in lateral upper arm when doing 818m freestyle swim. Pain is 0/10 at rest, up to 7-8/10 at worst. Has been swimming but primarily kicking now past 4-5 weeks. Worse with most strokes - freestyle, backstroke. Worse past 2 weeks also. + night pain. Tried aleve, massage in physical therapy a couple times. No numbness or tingling. No neck pain. No skin changes or bruising.  Past Medical History:  Diagnosis Date  . Adenomatous colon polyp 2011; 03/2015  . Basal cell carcinoma   . Diverticulosis   . Hyperlipidemia 2012   Diet/exercise x 75mo improved this.  . Low back pain     Current Outpatient Prescriptions on File Prior to Visit  Medication Sig Dispense Refill  . atorvastatin (LIPITOR) 10 MG tablet Take 1 tablet (10 mg total) by mouth daily. 90 tablet 3  . naproxen sodium (ANAPROX) 220 MG tablet Take 220 mg by mouth 2 (two) times daily with a meal.     No current facility-administered medications on file prior to visit.     Past Surgical History:  Procedure Laterality Date  . BASAL CELL CARCINOMA EXCISION    . COLONOSCOPY W/ POLYPECTOMY  10/2009; 03/2015   2011 had 1 polyp, 2016 had 2 polyps: recall 5 yrs  . LUMBAR [place] SURGERY  03/2010   L4-5    No Known Allergies  Social History   Social History  . Marital status: Married    Spouse name: N/A  . Number of children: 3  . Years of education: N/A   Occupational History  . 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 Smoking status: Never Smoker  . Smokeless tobacco: Never Used  . Alcohol use No  . Drug use: No  . Sexual activity: Not on file   Other Topics Concern  . Not on file   Social History Narrative   Married, 3 grown children.   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   No T/A/Ds.      Competetive swimmer for many years, still swims as his primary form of exercise.      Originally from [place] area, big [employer]    Family History  Problem Relation Age of Onset  . Cancer Mother 26    Colon  . Hyperlipidemia Father     BP (!) 134/92   Pulse 61   Ht 6\' 3"  (1.905 m)   Wt 215 lb (97.5 kg)   BMI 26.87 kg/m   Review of Systems: See HPI above.     Objective:  Physical Exam:  Gen: NAD, comfortable in exam room  Left shoulder: No swelling, ecchymoses.  No gross deformity. No TTP. FROM but severe pain with empty can - can initiate this up to 50 degrees but cannot complete this actively. Positive Hawkins, Neers. Positive crossover adduction. Negative Speeds, Yergasons. Strength 5-/5 with empty can and 5/5 resisted internal/external rotation.  Pain empty can. Negative apprehension. NV intact distally.  Right shoulder: FROM without pain.   Assessment & Plan:  1. Left shoulder pain - patient's lack of abduction, positive impingement testing suggest bursitis - often calcific subacromial bursitis will present in this manner with good strength, lack of injury but relatively abrupt decrease in motion.  We discussed options -  he would like to go ahead with physical therapy, diclofenac.  F/u in 5-6 weeks.  Consider imaging (u/s), injection, nitro patches if not improving as expected.  F/u in 5-6 weeks.

## 2016-02-15 ENCOUNTER — Telehealth: Payer: Self-pay | Admitting: Family Medicine

## 2016-02-15 DIAGNOSIS — R7303 Prediabetes: Secondary | ICD-10-CM

## 2016-02-15 HISTORY — DX: Prediabetes: R73.03

## 2016-02-15 LAB — HEPATIC FUNCTION PANEL
ALK PHOS: 60 U/L (ref 25–125)
ALT: 23 U/L (ref 10–40)
AST: 16 U/L (ref 14–40)
Bilirubin, Total: 0.6 mg/dL

## 2016-02-15 LAB — BASIC METABOLIC PANEL
BUN: 17 mg/dL (ref 4–21)
CREATININE: 1.1 mg/dL (ref 0.6–1.3)
GLUCOSE: 106 mg/dL
POTASSIUM: 5 mmol/L (ref 3.4–5.3)
Sodium: 139 mmol/L (ref 137–147)

## 2016-02-15 LAB — LIPID PANEL
Cholesterol: 175 mg/dL (ref 0–200)
HDL: 49 mg/dL (ref 35–70)
LDL Cholesterol: 102 mg/dL
Triglycerides: 122 mg/dL (ref 40–160)

## 2016-02-15 LAB — CBC AND DIFFERENTIAL
HCT: 43 % (ref 41–53)
Hemoglobin: 14.5 g/dL (ref 13.5–17.5)
PLATELETS: 219 10*3/uL (ref 150–399)
WBC: 6 10*3/mL

## 2016-02-15 LAB — HEMOGLOBIN A1C: HEMOGLOBIN A1C: 6.1

## 2016-02-15 LAB — TSH: TSH: 2.71 u[IU]/mL (ref 0.41–5.90)

## 2016-02-15 LAB — PSA: PSA: 0.9

## 2016-02-15 NOTE — Telephone Encounter (Signed)
Ok to refer him elsewhere for PT.  Thanks!

## 2016-02-16 ENCOUNTER — Encounter: Payer: Self-pay | Admitting: Family Medicine

## 2016-02-16 ENCOUNTER — Telehealth: Payer: Self-pay | Admitting: Family Medicine

## 2016-02-16 NOTE — Telephone Encounter (Signed)
See previous phone note.  

## 2016-02-16 NOTE — Telephone Encounter (Signed)
Sent PT referral to a different place. They will call him and get him set up with an appointment.

## 2016-02-17 ENCOUNTER — Telehealth: Payer: Self-pay | Admitting: Family Medicine

## 2016-02-17 NOTE — Telephone Encounter (Signed)
Completed.

## 2016-02-29 ENCOUNTER — Telehealth: Payer: Self-pay | Admitting: Family Medicine

## 2016-02-29 ENCOUNTER — Encounter: Payer: Self-pay | Admitting: Family Medicine

## 2016-02-29 NOTE — Telephone Encounter (Signed)
Pls notify pt that I reviewed his recent labs from 02/15/16 and noted he has prediabetes.  All other labs were good. I recommend low carbohydrate and low fat diet, try to increase cardiovascular exercise.  We should recheck fasting glucose and Hba1c in 6 months--lab visit only--dx is prediabetes.  --thx

## 2016-03-01 NOTE — Telephone Encounter (Signed)
Pt advised and voiced understanding.   

## 2016-03-14 ENCOUNTER — Ambulatory Visit (INDEPENDENT_AMBULATORY_CARE_PROVIDER_SITE_OTHER): Payer: 59 | Admitting: Family Medicine

## 2016-03-14 ENCOUNTER — Encounter: Payer: Self-pay | Admitting: Family Medicine

## 2016-03-14 VITALS — BP 122/72 | HR 49 | Ht 75.0 in | Wt 210.0 lb

## 2016-03-14 DIAGNOSIS — M25512 Pain in left shoulder: Secondary | ICD-10-CM | POA: Diagnosis not present

## 2016-03-14 MED ORDER — METHYLPREDNISOLONE ACETATE 40 MG/ML IJ SUSP
40.0000 mg | Freq: Once | INTRAMUSCULAR | Status: AC
  Administered 2016-03-14: 40 mg

## 2016-03-14 NOTE — Patient Instructions (Signed)
You have subacromial bursitis, impingement. Try to avoid painful activities (overhead activities, lifting with extended arm) as much as possible for next 7 days. Take diclofenac only if needed. Can take tylenol in addition to this. Continue with physical therapy if you're getting benefit with this - if you feel you've plateaued though it's ok to transition just to the home exercises. Consider nitro patches if still not improving following the injection today. Follow up with me in 5-6 weeks otherwise.

## 2016-03-18 NOTE — Progress Notes (Signed)
PCP and consultation requested by: Tammi Sou, MD  Subjective:   HPI: Patient is a 55 y.o. male here for left shoulder pain.  11/6: Patient denies known injury. He reports about 2 months ago when swimming he felt a pulling sensation in lateral upper arm when doing 869m freestyle swim. Pain is 0/10 at rest, up to 7-8/10 at worst. Has been swimming but primarily kicking now past 4-5 weeks. Worse with most strokes - freestyle, backstroke. Worse past 2 weeks also. + night pain. Tried aleve, massage in physical therapy a couple times. No numbness or tingling. No neck pain. No skin changes or bruising.  12/11: Patient reports his left shoulder is improving mildly. Pain is 0/10 up to 3/10 and sharp at times lateral shoulder. Difficulty reaching behind his back and at nighttime lying on this shoulder. Seeing improvement with physical therapy. Takes diclofenac only if needed. No skin changes, numbness.  Past Medical History:  Diagnosis Date  . Adenomatous colon polyp 2011; 03/2015  . Basal cell carcinoma   . 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 Diverticulosis   . Hyperlipidemia 2012   Diet/exercise x 66mo improved this.  . IFG (impaired fasting glucose) 02/15/2016   HbA1c 6.1%  . Left shoulder pain 02/2016   ? calcific bursitis: Dr. Bing Neighbors NSAIDs.  . Low back pain     Current Outpatient Prescriptions on File Prior to Visit  Medication Sig Dispense Refill  . atorvastatin (LIPITOR) 10 MG tablet Take 1 tablet (10 mg total) by mouth daily. 90 tablet 3  . diclofenac (VOLTAREN) 75 MG EC tablet Take 1 tablet (75 mg total) by mouth 2 (two) times daily. 60 tablet 1  . naproxen sodium (ANAPROX) 220 MG tablet Take 220 mg by mouth 2 (two) times daily with a meal.     No current facility-administered medications on file prior to visit.     Past Surgical History:  Procedure Laterality Date  . BASAL CELL CARCINOMA EXCISION    .  COLONOSCOPY W/ POLYPECTOMY  10/2009; 03/2015   2011 had 1 polyp, 2016 had 2 polyps: recall 5 yrs  . LUMBAR [place] SURGERY  03/2010   L4-5    No Known Allergies  Social History   Social History  . Marital status: Married    Spouse name: N/A  . Number of children: 3  . Years of education: N/A   Occupational History  . 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 Smoking status: Never Smoker  . Smokeless tobacco: Never Used  . Alcohol use No  . Drug use: No  . Sexual activity: Not on file   Other Topics Concern  . Not on file   Social History Narrative   Married, 3 grown children.   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      Competetive swimmer for many years, still swims as his primary form of exercise.      Originally from [place] area, big [employer]    Family History  Problem Relation Age of Onset  . Cancer Mother 108    Colon  . Hyperlipidemia Father     BP 122/72   Pulse (!) 49   Ht 6\' 3"  (1.905 m)   Wt 210 lb (95.3 kg)   BMI 26.25 kg/m   Review of Systems: See HPI above.     Objective:  Physical Exam:  Gen: NAD, comfortable in exam room  Left shoulder: No swelling, ecchymoses.  No gross deformity. No TTP. Full passive motion.  Active ER to 60 degrees, painful beyond this.  Active abduction painful beyond 90 degrees. Positive Hawkins, Neers. Negative Speeds, Yergasons. Strength 5-/5 with empty can and 5/5 resisted internal/external rotation.  Pain empty can. Negative apprehension. NV intact distally.  Right shoulder: FROM without pain.  MSK u/s Left shoulder:  No evidence rotator cuff tear of subscapularis, infraspinatus, or supraspinatus.  Biceps tendon appears normal.  Mild AC arthropathy.  Severe subacromial bursitis but no calcific component.   Assessment & Plan:  1. Left shoulder pain - 2/2 severe subacromial bursitis and impingement.  Subacromial injection given today.  Continue PT and  home exercises.  Consider nitro patches if not improving.  Diclofenac as needed.  F/u in 5-6 weeks.  After informed written consent, patient was seated on exam table. Left shoulder was prepped with alcohol swab and utilizing posterior approach, patient's left subacromial space was injected with 3:1 marcaine: depomedrol. Patient tolerated the procedure well without immediate complications.

## 2016-03-18 NOTE — Assessment & Plan Note (Signed)
2/2 severe subacromial bursitis and impingement.  Subacromial injection given today.  Continue PT and home exercises.  Consider nitro patches if not improving.  Diclofenac as needed.  F/u in 5-6 weeks.  After informed written consent, patient was seated on exam table. Left shoulder was prepped with alcohol swab and utilizing posterior approach, patient's left subacromial space was injected with 3:1 marcaine: depomedrol. Patient tolerated the procedure well without immediate complications.

## 2016-03-21 ENCOUNTER — Encounter: Payer: Self-pay | Admitting: Family Medicine

## 2016-03-30 ENCOUNTER — Ambulatory Visit (INDEPENDENT_AMBULATORY_CARE_PROVIDER_SITE_OTHER): Payer: 59 | Admitting: Family Medicine

## 2016-03-30 ENCOUNTER — Encounter: Payer: Self-pay | Admitting: Family Medicine

## 2016-03-30 VITALS — BP 108/72 | HR 45 | Temp 97.4°F | Resp 16 | Ht 75.0 in | Wt 212.5 lb

## 2016-03-30 DIAGNOSIS — R7303 Prediabetes: Secondary | ICD-10-CM | POA: Diagnosis not present

## 2016-03-30 NOTE — Progress Notes (Signed)
Pre visit review using our clinic review tool, if applicable. No additional management support is needed unless otherwise documented below in the visit note. 

## 2016-03-30 NOTE — Progress Notes (Signed)
Office Note 03/30/2016  CC:  Chief Complaint  Patient presents with  . Annual Exam    Labs done 02/15/16    HPI:  Thomas Hubbard is a 55 y.o. White male who is here for annual health maintenance exam.  However, he recently had his entire exam done at [facility] 02/2016, including DRE. Reviewed labs in detail with pt today.   We discussed diet and exercise today for his prediabetes. Focus on fruits/veggies, cut out simple carbs, watch portion size, eat lean meats. Low cal snacks. Exercise: he is getting into running now since his shoulder injury (left) is hindering swimming.  Past Medical History:  Diagnosis Date  . Adenomatous colon polyp 2011; 03/2015  . Basal cell carcinoma   . 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 Diverticulosis   . Hyperlipidemia 2012   Diet/exercise x 70mo improved this.  . IFG (impaired fasting glucose) 02/15/2016   HbA1c 6.1%  . Left shoulder pain 02/2016   ? calcific bursitis: Dr. Bing Neighbors NSAIDs.  Subacromial bursitis/impingement syndrome dx'd at f/u with Dr. Lavonna Rua injection given.  . Low back pain     Past Surgical History:  Procedure Laterality Date  . BASAL CELL CARCINOMA EXCISION    . COLONOSCOPY W/ POLYPECTOMY  10/2009; 03/2015   2011 had 1 polyp, 2016 had 2 polyps: recall 5 yrs  . LUMBAR [place] SURGERY  03/2010   L4-5    Family History  Problem Relation Age of Onset  . Cancer Mother 27    Colon  . Hyperlipidemia Father     Social History   Social History  . Marital status: Married    Spouse name: N/A  . Number of children: 3  . Years of education: N/A   Occupational History  . 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 Smoking status: Never Smoker  . Smokeless tobacco: Never Used  . Alcohol use No  . Drug use: No  . Sexual activity: Not on file   Other Topics Concern  . Not on file   Social History Narrative   Married, 3 grown children.    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      Competetive swimmer for many years, still swims as his primary form of exercise.      Originally from [place] area, big [employer]    Outpatient Medications Prior to Visit  Medication Sig Dispense Refill  . atorvastatin (LIPITOR) 10 MG tablet Take 1 tablet (10 mg total) by mouth daily. 90 tablet 3  . diclofenac (VOLTAREN) 75 MG EC tablet Take 1 tablet (75 mg total) by mouth 2 (two) times daily. (Patient taking differently: Take 75 mg by mouth 2 (two) times daily as needed. ) 60 tablet 1  . naproxen sodium (ANAPROX) 220 MG tablet Take 220 mg by mouth 2 (two) times daily as needed.      No facility-administered medications prior to visit.     No Known Allergies  ROS N/A PE; Blood pressure 108/72, pulse (!) 45, temperature 97.4 F (36.3 C), temperature source Oral, resp. rate 16, height 6\' 3"  (1.905 m), weight 212 lb 8 oz (96.4 kg), SpO2 98 %. Gen: Alert, well appearing.  Patient is oriented to person, place, time, and situation. AFFECT: pleasant, lucid thought and speech. No further exam today.  Pertinent labs:  Lab Results  Component Value Date   TSH 2.71 02/15/2016   Lab Results  Component Value Date   WBC 6.0 02/15/2016   HGB 14.5 02/15/2016   HCT 43 02/15/2016   PLT 219 02/15/2016   Lab Results  Component Value Date   CREATININE 1.1 02/15/2016   BUN 17 02/15/2016   NA 139 02/15/2016   K 5.0 02/15/2016   CL 106 03/25/2014   CO2 26 03/25/2014   Lab Results  Component Value Date   ALT 23 02/15/2016   AST 16 02/15/2016   ALKPHOS 60 02/15/2016   BILITOT 1.2 03/25/2014   Lab Results  Component Value Date   CHOL 175 02/15/2016   Lab Results  Component Value Date   HDL 49 02/15/2016   Lab Results  Component Value Date   LDLCALC 102 02/15/2016   Lab Results  Component Value Date   TRIG 122 02/15/2016   Lab Results  Component Value Date   CHOLHDL 3 03/31/2015   Lab Results   Component Value Date   PSA 0.9 02/15/2016   Lab Results  Component Value Date   HGBA1C 6.1 02/15/2016    ASSESSMENT AND PLAN:   Prediabetes. Reviewed labs in detail, discussed diet and exercise. He'll return for o/v in 6 mo to see what progress he's making and recheck HbA1c.  An After Visit Summary was printed and given to the patient.  FOLLOW UP:  Return in about 6 months (around 09/28/2016) for f/u prediabetes/do POCT HbA1c.  Signed:  Crissie Sickles, MD           03/30/2016

## 2016-04-06 DIAGNOSIS — M25612 Stiffness of left shoulder, not elsewhere classified: Secondary | ICD-10-CM | POA: Diagnosis not present

## 2016-04-06 DIAGNOSIS — M7542 Impingement syndrome of left shoulder: Secondary | ICD-10-CM | POA: Diagnosis not present

## 2016-04-06 DIAGNOSIS — M25512 Pain in left shoulder: Secondary | ICD-10-CM | POA: Diagnosis not present

## 2016-04-08 ENCOUNTER — Encounter: Payer: Self-pay | Admitting: Family Medicine

## 2016-04-08 ENCOUNTER — Ambulatory Visit (INDEPENDENT_AMBULATORY_CARE_PROVIDER_SITE_OTHER): Payer: BLUE CROSS/BLUE SHIELD | Admitting: Family Medicine

## 2016-04-08 DIAGNOSIS — M25512 Pain in left shoulder: Secondary | ICD-10-CM | POA: Diagnosis not present

## 2016-04-11 NOTE — Assessment & Plan Note (Signed)
2/2 severe subacromial bursitis and impingement.  Unfortunately has not improved with subacromial injection and physical therapy.  Will go ahead with MRI at this point.  Will call him with results and likely ortho referral.

## 2016-04-11 NOTE — Progress Notes (Signed)
PCP and consultation requested by: Tammi Sou, MD  Subjective:   HPI: Patient is a 56 y.o. male here for left shoulder pain.  11/6: Patient denies known injury. He reports about 2 months ago when swimming he felt a pulling sensation in lateral upper arm when doing 839m freestyle swim. Pain is 0/10 at rest, up to 7-8/10 at worst. Has been swimming but primarily kicking now past 4-5 weeks. Worse with most strokes - freestyle, backstroke. Worse past 2 weeks also. + night pain. Tried aleve, massage in physical therapy a couple times. No numbness or tingling. No neck pain. No skin changes or bruising.  12/11: Patient reports his left shoulder is improving mildly. Pain is 0/10 up to 3/10 and sharp at times lateral shoulder. Difficulty reaching behind his back and at nighttime lying on this shoulder. Seeing improvement with physical therapy. Takes diclofenac only if needed. No skin changes, numbness.  04/08/16: Patient reports at rest pain is 0/10 but up to 8/10 with overhead motions, reaching behind back. Improvement has plateaued with physical therapy. No swelling. No skin changes, numbness.  Past Medical History:  Diagnosis Date  . Adenomatous colon polyp 2011; 03/2015  . Basal cell carcinoma   . 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 Diverticulosis   . Hyperlipidemia 2012   Diet/exercise x 48mo improved this.  . IFG (impaired fasting glucose) 02/15/2016   HbA1c 6.1%  . Left shoulder pain 02/2016   ? calcific bursitis: Dr. Bing Neighbors NSAIDs.  Subacromial bursitis/impingement syndrome dx'd at f/u with Dr. Lavonna Rua injection given.  . Low back pain     Current Outpatient Prescriptions on File Prior to Visit  Medication Sig Dispense Refill  . atorvastatin (LIPITOR) 10 MG tablet Take 1 tablet (10 mg total) by mouth daily. 90 tablet 3  . diclofenac (VOLTAREN) 75 MG EC tablet Take 1 tablet (75 mg total) by mouth 2 (two) times  daily. (Patient taking differently: Take 75 mg by mouth 2 (two) times daily as needed. ) 60 tablet 1  . naproxen sodium (ANAPROX) 220 MG tablet Take 220 mg by mouth 2 (two) times daily as needed.      No current facility-administered medications on file prior to visit.     Past Surgical History:  Procedure Laterality Date  . BASAL CELL CARCINOMA EXCISION    . COLONOSCOPY W/ POLYPECTOMY  10/2009; 03/2015   2011 had 1 polyp, 2016 had 2 polyps: recall 5 yrs  . LUMBAR [place] SURGERY  03/2010   L4-5    No Known Allergies  Social History   Social History  . Marital status: Married    Spouse name: N/A  . Number of children: 3  . Years of education: N/A   Occupational History  . 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 Smoking status: Never Smoker  . Smokeless tobacco: Never Used  . Alcohol use No  . Drug use: No  . Sexual activity: Not on file   Other Topics Concern  . Not on file   Social History Narrative   Married, 3 grown children.   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      Competetive swimmer for many years, still swims as his primary form of exercise.      Originally from [place] area, big [employer]    Family History  Problem Relation Age of Onset  . Cancer Mother 1  Colon  . Hyperlipidemia Father     BP 130/88   Pulse (!) 56   Ht 6\' 3"  (1.905 m)   Wt 208 lb (94.3 kg)   BMI 26.00 kg/m   Review of Systems: See HPI above.     Objective:  Physical Exam:  Gen: NAD, comfortable in exam room  Left shoulder: No swelling, ecchymoses.  No gross deformity. No TTP. Full passive motion.  Active ER to 60 degrees, painful beyond this.  Active abduction painful beyond 90 degrees. Positive Hawkins, Neers. Negative Yergasons. Strength 5-/5 with empty can and 5/5 resisted internal/external rotation.  Pain empty can. Negative apprehension. NV intact distally.  Right shoulder: FROM without  pain.  Assessment & Plan:  1. Left shoulder pain - 2/2 severe subacromial bursitis and impingement.  Unfortunately has not improved with subacromial injection and physical therapy.  Will go ahead with MRI at this point.  Will call him with results and likely ortho referral.

## 2016-04-12 ENCOUNTER — Encounter: Payer: Self-pay | Admitting: Family Medicine

## 2016-04-20 ENCOUNTER — Ambulatory Visit: Payer: Self-pay | Admitting: Family Medicine

## 2016-05-10 NOTE — Addendum Note (Signed)
Addended by: Sherrie George F on: 05/10/2016 11:55 AM   Modules accepted: Orders

## 2016-05-14 ENCOUNTER — Ambulatory Visit (HOSPITAL_BASED_OUTPATIENT_CLINIC_OR_DEPARTMENT_OTHER)
Admission: RE | Admit: 2016-05-14 | Discharge: 2016-05-14 | Disposition: A | Discharge status: Home / Self Care | Payer: BLUE CROSS/BLUE SHIELD | Source: Ambulatory Visit | Attending: Family Medicine | Admitting: Family Medicine

## 2016-05-14 DIAGNOSIS — M7582 Other shoulder lesions, left shoulder: Secondary | ICD-10-CM | POA: Diagnosis not present

## 2016-05-14 DIAGNOSIS — M7592 Shoulder lesion, unspecified, left shoulder: Secondary | ICD-10-CM | POA: Diagnosis not present

## 2016-05-14 DIAGNOSIS — M25512 Pain in left shoulder: Secondary | ICD-10-CM

## 2016-05-16 ENCOUNTER — Ambulatory Visit: Payer: 59 | Admitting: Family Medicine

## 2016-05-17 ENCOUNTER — Ambulatory Visit (INDEPENDENT_AMBULATORY_CARE_PROVIDER_SITE_OTHER): Payer: BLUE CROSS/BLUE SHIELD | Admitting: Family Medicine

## 2016-05-17 ENCOUNTER — Encounter: Payer: Self-pay | Admitting: Family Medicine

## 2016-05-17 DIAGNOSIS — M25512 Pain in left shoulder: Secondary | ICD-10-CM | POA: Diagnosis not present

## 2016-05-17 MED ORDER — METHYLPREDNISOLONE ACETATE 40 MG/ML IJ SUSP
40.0000 mg | Freq: Once | INTRAMUSCULAR | Status: AC
  Administered 2016-05-17: 40 mg via INTRA_ARTICULAR

## 2016-05-17 NOTE — Progress Notes (Signed)
PCP and consultation requested by: Thomas Sou, MD  Subjective:   HPI: Patient is a 56 y.o. male here for left shoulder pain.  11/6: Patient denies known injury. He reports about 2 months ago when swimming he felt a pulling sensation in lateral upper arm when doing 858m freestyle swim. Pain is 0/10 at rest, up to 7-8/10 at worst. Has been swimming but primarily kicking now past 4-5 weeks. Worse with most strokes - freestyle, backstroke. Worse past 2 weeks also. + night pain. Tried aleve, massage in physical therapy a couple times. No numbness or tingling. No neck pain. No skin changes or bruising.  12/11: Patient reports his left shoulder is improving mildly. Pain is 0/10 up to 3/10 and sharp at times lateral shoulder. Difficulty reaching behind his back and at nighttime lying on this shoulder. Seeing improvement with physical therapy. Takes diclofenac only if needed. No skin changes, numbness.  04/08/16: Patient reports at rest pain is 0/10 but up to 8/10 with overhead motions, reaching behind back. Improvement has plateaued with physical therapy. No swelling. No skin changes, numbness.  2/13: Patient came in today after reviewing MRI by phone. Going to do intraarticular injection, home exercises.  Past Medical History:  Diagnosis Date  . Adenomatous colon polyp 2011; 03/2015  . Basal cell carcinoma   . 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 Diverticulosis   . Hyperlipidemia 2012   Diet/exercise x 23mo improved this.  . IFG (impaired fasting glucose) 02/15/2016   HbA1c 6.1%  . Left shoulder pain 02/2016   ? calcific bursitis: Dr. Bing Neighbors NSAIDs.  Subacromial bursitis/impingement syndrome dx'd at f/u with Dr. Lavonna Rua injection given.  No improvement: MRI planned as of 04/2016.  Marland Kitchen Low back pain     Current Outpatient Prescriptions on File Prior to Visit  Medication Sig Dispense Refill  . atorvastatin (LIPITOR) 10  MG tablet Take 1 tablet (10 mg total) by mouth daily. 90 tablet 3  . diclofenac (VOLTAREN) 75 MG EC tablet Take 1 tablet (75 mg total) by mouth 2 (two) times daily. (Patient taking differently: Take 75 mg by mouth 2 (two) times daily as needed. ) 60 tablet 1  . naproxen sodium (ANAPROX) 220 MG tablet Take 220 mg by mouth 2 (two) times daily as needed.      No current facility-administered medications on file prior to visit.     Past Surgical History:  Procedure Laterality Date  . BASAL CELL CARCINOMA EXCISION    . COLONOSCOPY W/ POLYPECTOMY  10/2009; 03/2015   2011 had 1 polyp, 2016 had 2 polyps: recall 5 yrs  . LUMBAR [place] SURGERY  03/2010   L4-5    No Known Allergies  Social History   Social History  . Marital status: Married    Spouse name: Thomas Hubbard  . Number of children: 3  . Years of education: Thomas Hubbard   Occupational History  . 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 Smoking status: Never Smoker  . Smokeless tobacco: Never Used  . Alcohol use No  . Drug use: No  . Sexual activity: Not on file   Other Topics Concern  . Not on file   Social History Narrative   Married, 3 grown children.   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      Competetive swimmer for many years, still swims as his primary form of exercise.  Originally from [place] area, big [employer]    Family History  Problem Relation Age of Onset  . Cancer Mother 73    Colon  . Hyperlipidemia Father     BP 120/78   Pulse (!) 47   Ht 6\' 3"  (1.905 m)   Wt 205 lb (93 kg)   BMI 25.62 kg/m   Review of Systems: See HPI above.     Objective:  Physical Exam:  Gen: NAD, comfortable in exam room  Left shoulder: No swelling, ecchymoses.  No gross deformity. No TTP. ER limited to 20 degrees now, flexion and abduction even passively to 90 degrees. NV intact distally.  Right shoulder: FROM without pain.  Assessment & Plan:  1. Left shoulder pain  - Initially due to severe subacromial bursitis and impingement - these have resolved and issue now is adhesive capsulitis.  Has questionable tiny insertional rotator cuff tear I do not believe is symptomatic.  Intraarticular injection given today and reviewed codman exercises.  F/u in 4-6 weeks.  Consider repeat intraarticular injection, PT in future.  After informed written consent, patient was seated on exam table. Left shoulder was prepped with alcohol swab and utilizing posterior approach, patient's left glenohumeral space was injected with 3:1 bupivicaine: depomedrol. Patient tolerated the procedure well without immediate complications.

## 2016-05-18 NOTE — Assessment & Plan Note (Signed)
Initially due to severe subacromial bursitis and impingement - these have resolved and issue now is adhesive capsulitis.  Has questionable tiny insertional rotator cuff tear I do not believe is symptomatic.  Intraarticular injection given today and reviewed codman exercises.  F/u in 4-6 weeks.  Consider repeat intraarticular injection, PT in future.  After informed written consent, patient was seated on exam table. Left shoulder was prepped with alcohol swab and utilizing posterior approach, patient's left glenohumeral space was injected with 3:1 bupivicaine: depomedrol. Patient tolerated the procedure well without immediate complications.

## 2016-05-19 ENCOUNTER — Encounter: Payer: Self-pay | Admitting: Family Medicine

## 2016-06-14 ENCOUNTER — Ambulatory Visit: Payer: BLUE CROSS/BLUE SHIELD | Admitting: Family Medicine

## 2016-06-16 ENCOUNTER — Ambulatory Visit (INDEPENDENT_AMBULATORY_CARE_PROVIDER_SITE_OTHER): Payer: BLUE CROSS/BLUE SHIELD | Admitting: Family Medicine

## 2016-06-16 ENCOUNTER — Encounter: Payer: Self-pay | Admitting: Family Medicine

## 2016-06-16 VITALS — BP 123/79 | HR 55 | Ht 75.0 in | Wt 205.0 lb

## 2016-06-16 DIAGNOSIS — M25512 Pain in left shoulder: Secondary | ICD-10-CM | POA: Diagnosis not present

## 2016-06-16 MED ORDER — METHYLPREDNISOLONE ACETATE 40 MG/ML IJ SUSP
40.0000 mg | Freq: Once | INTRAMUSCULAR | Status: AC
  Administered 2016-06-16: 40 mg via INTRA_ARTICULAR

## 2016-06-20 NOTE — Progress Notes (Signed)
PCP and consultation requested by: Tammi Sou, MD  Subjective:   HPI: Patient is a 56 y.o. male here for left shoulder pain.  11/6: Patient denies known injury. He reports about 2 months ago when swimming he felt a pulling sensation in lateral upper arm when doing 887m freestyle swim. Pain is 0/10 at rest, up to 7-8/10 at worst. Has been swimming but primarily kicking now past 4-5 weeks. Worse with most strokes - freestyle, backstroke. Worse past 2 weeks also. + night pain. Tried aleve, massage in physical therapy a couple times. No numbness or tingling. No neck pain. No skin changes or bruising.  12/11: Patient reports his left shoulder is improving mildly. Pain is 0/10 up to 3/10 and sharp at times lateral shoulder. Difficulty reaching behind his back and at nighttime lying on this shoulder. Seeing improvement with physical therapy. Takes diclofenac only if needed. No skin changes, numbness.  04/08/16: Patient reports at rest pain is 0/10 but up to 8/10 with overhead motions, reaching behind back. Improvement has plateaued with physical therapy. No swelling. No skin changes, numbness.  2/13: Patient came in today after reviewing MRI by phone. Going to do intraarticular injection, home exercises.  3/15: Patient returns noting some improvement in motion since last visit. Doing well following injection. Pain level 0/10 at rest, up to 3/10 at worst trying to go overhead. No skin changes, numbness.  Past Medical History:  Diagnosis Date  . Adenomatous colon polyp 2011; 03/2015  . Basal cell carcinoma   . 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 Diverticulosis   . Hyperlipidemia 2012   Diet/exercise x 40mo improved this.  . IFG (impaired fasting glucose) 02/15/2016   HbA1c 6.1%  . Left shoulder pain 02/2016   ? calcific bursitis: Dr. Bing Neighbors NSAIDs.  Subacromial bursitis/impingement syndrome dx'd at f/u with Dr.  Lavonna Rua injection given.  No improvement: MRI showed small insertional RC tear and adhesive capsulitis--steroid injection done again, PT likely in future.  . Low back pain     Current Outpatient Prescriptions on File Prior to Visit  Medication Sig Dispense Refill  . atorvastatin (LIPITOR) 10 MG tablet Take 1 tablet (10 mg total) by mouth daily. 90 tablet 3  . diclofenac (VOLTAREN) 75 MG EC tablet Take 1 tablet (75 mg total) by mouth 2 (two) times daily. (Patient taking differently: Take 75 mg by mouth 2 (two) times daily as needed. ) 60 tablet 1  . naproxen sodium (ANAPROX) 220 MG tablet Take 220 mg by mouth 2 (two) times daily as needed.      No current facility-administered medications on file prior to visit.     Past Surgical History:  Procedure Laterality Date  . BASAL CELL CARCINOMA EXCISION    . COLONOSCOPY W/ POLYPECTOMY  10/2009; 03/2015   2011 had 1 polyp, 2016 had 2 polyps: recall 5 yrs  . LUMBAR [place] SURGERY  03/2010   L4-5    No Known Allergies  Social History   Social History  . Marital status: Married    Spouse name: N/A  . Number of children: 3  . Years of education: N/A   Occupational History  . 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 Smoking status: Never Smoker  . Smokeless tobacco: Never Used  . Alcohol use No  . Drug use: No  . Sexual activity: Not on file   Other Topics Concern  . Not on file  Social History Narrative   Married, 3 grown children.   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      Competetive swimmer for many years, still swims as his primary form of exercise.      Originally from [place] area, big [employer]    Family History  Problem Relation Age of Onset  . Cancer Mother 3    Colon  . Hyperlipidemia Father     BP 123/79   Pulse (!) 55   Ht 6\' 3"  (1.905 m)   Wt 205 lb (93 kg)   BMI 25.62 kg/m   Review of Systems: See HPI above.     Objective:   Physical Exam:  Gen: NAD, comfortable in exam room  Left shoulder: No swelling, ecchymoses.  No gross deformity. No TTP. ER limited to 30 degrees now, flexion and abduction to 110 degrees flexion, 100 degrees abduction, improved. NV intact distally.  Right shoulder: FROM without pain.  Assessment & Plan:  1. Left shoulder pain - Initially due to severe subacromial bursitis and impingement - these have resolved and issue now is adhesive capsulitis.  Symptoms have improved quite a bit following injection and motion improved some.  Second intraarticular injection given today and he will restart physical therapy also.  F/u in 1 month for possible third and final intraarticular injection if not improving as expected.  After informed written consent, patient was seated on exam table. Left shoulder was prepped with alcohol swab and utilizing posterior approach, patient's left glenohumeral space was injected with 3:1 bupivicaine: depomedrol. Patient tolerated the procedure well without immediate complications.

## 2016-06-20 NOTE — Assessment & Plan Note (Signed)
Initially due to severe subacromial bursitis and impingement - these have resolved and issue now is adhesive capsulitis.  Symptoms have improved quite a bit following injection and motion improved some.  Second intraarticular injection given today and he will restart physical therapy also.  F/u in 1 month for possible third and final intraarticular injection if not improving as expected.  After informed written consent, patient was seated on exam table. Left shoulder was prepped with alcohol swab and utilizing posterior approach, patient's left glenohumeral space was injected with 3:1 bupivicaine: depomedrol. Patient tolerated the procedure well without immediate complications.

## 2016-06-21 DIAGNOSIS — M25512 Pain in left shoulder: Secondary | ICD-10-CM | POA: Diagnosis not present

## 2016-06-21 DIAGNOSIS — M7502 Adhesive capsulitis of left shoulder: Secondary | ICD-10-CM | POA: Diagnosis not present

## 2016-06-21 DIAGNOSIS — M25612 Stiffness of left shoulder, not elsewhere classified: Secondary | ICD-10-CM | POA: Diagnosis not present

## 2016-06-22 ENCOUNTER — Encounter: Payer: Self-pay | Admitting: Family Medicine

## 2016-06-22 DIAGNOSIS — M25612 Stiffness of left shoulder, not elsewhere classified: Secondary | ICD-10-CM | POA: Diagnosis not present

## 2016-06-22 DIAGNOSIS — M7502 Adhesive capsulitis of left shoulder: Secondary | ICD-10-CM | POA: Diagnosis not present

## 2016-06-22 DIAGNOSIS — M25512 Pain in left shoulder: Secondary | ICD-10-CM | POA: Diagnosis not present

## 2016-06-24 DIAGNOSIS — M7502 Adhesive capsulitis of left shoulder: Secondary | ICD-10-CM | POA: Diagnosis not present

## 2016-06-24 DIAGNOSIS — M25512 Pain in left shoulder: Secondary | ICD-10-CM | POA: Diagnosis not present

## 2016-06-24 DIAGNOSIS — M25612 Stiffness of left shoulder, not elsewhere classified: Secondary | ICD-10-CM | POA: Diagnosis not present

## 2016-06-27 DIAGNOSIS — M7502 Adhesive capsulitis of left shoulder: Secondary | ICD-10-CM | POA: Diagnosis not present

## 2016-06-27 DIAGNOSIS — M25612 Stiffness of left shoulder, not elsewhere classified: Secondary | ICD-10-CM | POA: Diagnosis not present

## 2016-06-27 DIAGNOSIS — M25512 Pain in left shoulder: Secondary | ICD-10-CM | POA: Diagnosis not present

## 2016-06-29 DIAGNOSIS — M25512 Pain in left shoulder: Secondary | ICD-10-CM | POA: Diagnosis not present

## 2016-06-29 DIAGNOSIS — M7502 Adhesive capsulitis of left shoulder: Secondary | ICD-10-CM | POA: Diagnosis not present

## 2016-06-29 DIAGNOSIS — M25612 Stiffness of left shoulder, not elsewhere classified: Secondary | ICD-10-CM | POA: Diagnosis not present

## 2016-06-30 DIAGNOSIS — M25512 Pain in left shoulder: Secondary | ICD-10-CM | POA: Diagnosis not present

## 2016-06-30 DIAGNOSIS — M7502 Adhesive capsulitis of left shoulder: Secondary | ICD-10-CM | POA: Diagnosis not present

## 2016-06-30 DIAGNOSIS — M25612 Stiffness of left shoulder, not elsewhere classified: Secondary | ICD-10-CM | POA: Diagnosis not present

## 2016-07-03 HISTORY — PX: SHOULDER SURGERY: SHX246

## 2016-07-04 DIAGNOSIS — M7502 Adhesive capsulitis of left shoulder: Secondary | ICD-10-CM | POA: Diagnosis not present

## 2016-07-04 DIAGNOSIS — M25512 Pain in left shoulder: Secondary | ICD-10-CM | POA: Diagnosis not present

## 2016-07-04 DIAGNOSIS — M25612 Stiffness of left shoulder, not elsewhere classified: Secondary | ICD-10-CM | POA: Diagnosis not present

## 2016-07-06 DIAGNOSIS — M25512 Pain in left shoulder: Secondary | ICD-10-CM | POA: Diagnosis not present

## 2016-07-06 DIAGNOSIS — M7502 Adhesive capsulitis of left shoulder: Secondary | ICD-10-CM | POA: Diagnosis not present

## 2016-07-06 DIAGNOSIS — M25612 Stiffness of left shoulder, not elsewhere classified: Secondary | ICD-10-CM | POA: Diagnosis not present

## 2016-07-08 DIAGNOSIS — M7502 Adhesive capsulitis of left shoulder: Secondary | ICD-10-CM | POA: Diagnosis not present

## 2016-07-08 DIAGNOSIS — M25512 Pain in left shoulder: Secondary | ICD-10-CM | POA: Diagnosis not present

## 2016-07-08 DIAGNOSIS — M25612 Stiffness of left shoulder, not elsewhere classified: Secondary | ICD-10-CM | POA: Diagnosis not present

## 2016-07-11 DIAGNOSIS — M7502 Adhesive capsulitis of left shoulder: Secondary | ICD-10-CM | POA: Diagnosis not present

## 2016-07-11 DIAGNOSIS — M25612 Stiffness of left shoulder, not elsewhere classified: Secondary | ICD-10-CM | POA: Diagnosis not present

## 2016-07-11 DIAGNOSIS — M25512 Pain in left shoulder: Secondary | ICD-10-CM | POA: Diagnosis not present

## 2016-07-13 DIAGNOSIS — M25612 Stiffness of left shoulder, not elsewhere classified: Secondary | ICD-10-CM | POA: Diagnosis not present

## 2016-07-13 DIAGNOSIS — M7502 Adhesive capsulitis of left shoulder: Secondary | ICD-10-CM | POA: Diagnosis not present

## 2016-07-13 DIAGNOSIS — M25512 Pain in left shoulder: Secondary | ICD-10-CM | POA: Diagnosis not present

## 2016-07-19 DIAGNOSIS — M25512 Pain in left shoulder: Secondary | ICD-10-CM | POA: Diagnosis not present

## 2016-07-29 ENCOUNTER — Telehealth: Payer: Self-pay | Admitting: Family Medicine

## 2016-07-29 NOTE — Telephone Encounter (Signed)
OK-this is fine.  Signed:  Crissie Sickles, MD           07/29/2016

## 2016-07-29 NOTE — Telephone Encounter (Signed)
Patient's wife scheduled a nurse visit for the shingrex vaccine on 08/05/16 for her husband.

## 2016-08-01 DIAGNOSIS — G8918 Other acute postprocedural pain: Secondary | ICD-10-CM | POA: Diagnosis not present

## 2016-08-01 DIAGNOSIS — M7542 Impingement syndrome of left shoulder: Secondary | ICD-10-CM | POA: Diagnosis not present

## 2016-08-01 DIAGNOSIS — M7502 Adhesive capsulitis of left shoulder: Secondary | ICD-10-CM | POA: Diagnosis not present

## 2016-08-01 DIAGNOSIS — M7552 Bursitis of left shoulder: Secondary | ICD-10-CM | POA: Diagnosis not present

## 2016-08-01 DIAGNOSIS — M19012 Primary osteoarthritis, left shoulder: Secondary | ICD-10-CM | POA: Diagnosis not present

## 2016-08-01 DIAGNOSIS — M24612 Ankylosis, left shoulder: Secondary | ICD-10-CM | POA: Diagnosis not present

## 2016-08-01 DIAGNOSIS — M24112 Other articular cartilage disorders, left shoulder: Secondary | ICD-10-CM | POA: Diagnosis not present

## 2016-08-01 DIAGNOSIS — M7582 Other shoulder lesions, left shoulder: Secondary | ICD-10-CM | POA: Diagnosis not present

## 2016-08-02 ENCOUNTER — Ambulatory Visit: Payer: BLUE CROSS/BLUE SHIELD

## 2016-08-02 DIAGNOSIS — M7502 Adhesive capsulitis of left shoulder: Secondary | ICD-10-CM | POA: Diagnosis not present

## 2016-08-02 DIAGNOSIS — M25612 Stiffness of left shoulder, not elsewhere classified: Secondary | ICD-10-CM | POA: Diagnosis not present

## 2016-08-02 DIAGNOSIS — M25512 Pain in left shoulder: Secondary | ICD-10-CM | POA: Diagnosis not present

## 2016-08-04 DIAGNOSIS — M25512 Pain in left shoulder: Secondary | ICD-10-CM | POA: Diagnosis not present

## 2016-08-04 DIAGNOSIS — M7502 Adhesive capsulitis of left shoulder: Secondary | ICD-10-CM | POA: Diagnosis not present

## 2016-08-04 DIAGNOSIS — M25612 Stiffness of left shoulder, not elsewhere classified: Secondary | ICD-10-CM | POA: Diagnosis not present

## 2016-08-05 ENCOUNTER — Ambulatory Visit: Payer: BLUE CROSS/BLUE SHIELD

## 2016-08-05 DIAGNOSIS — M25512 Pain in left shoulder: Secondary | ICD-10-CM | POA: Diagnosis not present

## 2016-08-05 DIAGNOSIS — M25612 Stiffness of left shoulder, not elsewhere classified: Secondary | ICD-10-CM | POA: Diagnosis not present

## 2016-08-05 DIAGNOSIS — M7502 Adhesive capsulitis of left shoulder: Secondary | ICD-10-CM | POA: Diagnosis not present

## 2016-08-08 DIAGNOSIS — M25512 Pain in left shoulder: Secondary | ICD-10-CM | POA: Diagnosis not present

## 2016-08-08 DIAGNOSIS — M25612 Stiffness of left shoulder, not elsewhere classified: Secondary | ICD-10-CM | POA: Diagnosis not present

## 2016-08-08 DIAGNOSIS — M7502 Adhesive capsulitis of left shoulder: Secondary | ICD-10-CM | POA: Diagnosis not present

## 2016-08-09 DIAGNOSIS — M25512 Pain in left shoulder: Secondary | ICD-10-CM | POA: Diagnosis not present

## 2016-08-10 DIAGNOSIS — M7502 Adhesive capsulitis of left shoulder: Secondary | ICD-10-CM | POA: Diagnosis not present

## 2016-08-10 DIAGNOSIS — M25512 Pain in left shoulder: Secondary | ICD-10-CM | POA: Diagnosis not present

## 2016-08-10 DIAGNOSIS — M25612 Stiffness of left shoulder, not elsewhere classified: Secondary | ICD-10-CM | POA: Diagnosis not present

## 2016-08-11 ENCOUNTER — Ambulatory Visit (INDEPENDENT_AMBULATORY_CARE_PROVIDER_SITE_OTHER): Payer: BLUE CROSS/BLUE SHIELD | Admitting: Family Medicine

## 2016-08-11 DIAGNOSIS — M25612 Stiffness of left shoulder, not elsewhere classified: Secondary | ICD-10-CM | POA: Diagnosis not present

## 2016-08-11 DIAGNOSIS — M7502 Adhesive capsulitis of left shoulder: Secondary | ICD-10-CM | POA: Diagnosis not present

## 2016-08-11 DIAGNOSIS — Z23 Encounter for immunization: Secondary | ICD-10-CM | POA: Diagnosis not present

## 2016-08-11 DIAGNOSIS — M25512 Pain in left shoulder: Secondary | ICD-10-CM | POA: Diagnosis not present

## 2016-08-12 NOTE — Progress Notes (Signed)
Noted.  Agree.  Signed:  Crissie Sickles, MD           08/12/2016

## 2016-08-15 DIAGNOSIS — M7502 Adhesive capsulitis of left shoulder: Secondary | ICD-10-CM | POA: Diagnosis not present

## 2016-08-15 DIAGNOSIS — M25612 Stiffness of left shoulder, not elsewhere classified: Secondary | ICD-10-CM | POA: Diagnosis not present

## 2016-08-15 DIAGNOSIS — M25512 Pain in left shoulder: Secondary | ICD-10-CM | POA: Diagnosis not present

## 2016-08-17 DIAGNOSIS — M25512 Pain in left shoulder: Secondary | ICD-10-CM | POA: Diagnosis not present

## 2016-08-17 DIAGNOSIS — M7502 Adhesive capsulitis of left shoulder: Secondary | ICD-10-CM | POA: Diagnosis not present

## 2016-08-17 DIAGNOSIS — M25612 Stiffness of left shoulder, not elsewhere classified: Secondary | ICD-10-CM | POA: Diagnosis not present

## 2016-08-19 DIAGNOSIS — M25512 Pain in left shoulder: Secondary | ICD-10-CM | POA: Diagnosis not present

## 2016-08-19 DIAGNOSIS — M7502 Adhesive capsulitis of left shoulder: Secondary | ICD-10-CM | POA: Diagnosis not present

## 2016-08-19 DIAGNOSIS — M25612 Stiffness of left shoulder, not elsewhere classified: Secondary | ICD-10-CM | POA: Diagnosis not present

## 2016-08-22 DIAGNOSIS — M25612 Stiffness of left shoulder, not elsewhere classified: Secondary | ICD-10-CM | POA: Diagnosis not present

## 2016-08-22 DIAGNOSIS — M25512 Pain in left shoulder: Secondary | ICD-10-CM | POA: Diagnosis not present

## 2016-08-22 DIAGNOSIS — M7502 Adhesive capsulitis of left shoulder: Secondary | ICD-10-CM | POA: Diagnosis not present

## 2016-08-24 DIAGNOSIS — M25512 Pain in left shoulder: Secondary | ICD-10-CM | POA: Diagnosis not present

## 2016-08-24 DIAGNOSIS — M25612 Stiffness of left shoulder, not elsewhere classified: Secondary | ICD-10-CM | POA: Diagnosis not present

## 2016-08-24 DIAGNOSIS — M7502 Adhesive capsulitis of left shoulder: Secondary | ICD-10-CM | POA: Diagnosis not present

## 2016-08-26 DIAGNOSIS — M25612 Stiffness of left shoulder, not elsewhere classified: Secondary | ICD-10-CM | POA: Diagnosis not present

## 2016-08-26 DIAGNOSIS — M7502 Adhesive capsulitis of left shoulder: Secondary | ICD-10-CM | POA: Diagnosis not present

## 2016-08-26 DIAGNOSIS — M25512 Pain in left shoulder: Secondary | ICD-10-CM | POA: Diagnosis not present

## 2016-08-30 DIAGNOSIS — M25612 Stiffness of left shoulder, not elsewhere classified: Secondary | ICD-10-CM | POA: Diagnosis not present

## 2016-08-30 DIAGNOSIS — M25512 Pain in left shoulder: Secondary | ICD-10-CM | POA: Diagnosis not present

## 2016-08-30 DIAGNOSIS — M7502 Adhesive capsulitis of left shoulder: Secondary | ICD-10-CM | POA: Diagnosis not present

## 2016-09-02 DIAGNOSIS — M25612 Stiffness of left shoulder, not elsewhere classified: Secondary | ICD-10-CM | POA: Diagnosis not present

## 2016-09-02 DIAGNOSIS — M7502 Adhesive capsulitis of left shoulder: Secondary | ICD-10-CM | POA: Diagnosis not present

## 2016-09-02 DIAGNOSIS — M25512 Pain in left shoulder: Secondary | ICD-10-CM | POA: Diagnosis not present

## 2016-09-05 ENCOUNTER — Other Ambulatory Visit: Payer: Self-pay | Admitting: *Deleted

## 2016-09-05 DIAGNOSIS — M25612 Stiffness of left shoulder, not elsewhere classified: Secondary | ICD-10-CM | POA: Diagnosis not present

## 2016-09-05 DIAGNOSIS — M25512 Pain in left shoulder: Secondary | ICD-10-CM | POA: Diagnosis not present

## 2016-09-05 DIAGNOSIS — M7502 Adhesive capsulitis of left shoulder: Secondary | ICD-10-CM | POA: Diagnosis not present

## 2016-09-05 MED ORDER — ATORVASTATIN CALCIUM 10 MG PO TABS
10.0000 mg | ORAL_TABLET | Freq: Every day | ORAL | 1 refills | Status: DC
Start: 2016-09-05 — End: 2017-02-04

## 2016-09-05 NOTE — Telephone Encounter (Signed)
[facility].  RF request for atorvastatin LOV: 03/30/16 Next ov: 10/24/16 Last written: 06/23/15 #90 w/ 3RF

## 2016-09-08 DIAGNOSIS — M25612 Stiffness of left shoulder, not elsewhere classified: Secondary | ICD-10-CM | POA: Diagnosis not present

## 2016-09-08 DIAGNOSIS — M25512 Pain in left shoulder: Secondary | ICD-10-CM | POA: Diagnosis not present

## 2016-09-08 DIAGNOSIS — M7502 Adhesive capsulitis of left shoulder: Secondary | ICD-10-CM | POA: Diagnosis not present

## 2016-09-15 DIAGNOSIS — M25512 Pain in left shoulder: Secondary | ICD-10-CM | POA: Diagnosis not present

## 2016-09-15 DIAGNOSIS — M25612 Stiffness of left shoulder, not elsewhere classified: Secondary | ICD-10-CM | POA: Diagnosis not present

## 2016-09-15 DIAGNOSIS — M7502 Adhesive capsulitis of left shoulder: Secondary | ICD-10-CM | POA: Diagnosis not present

## 2016-09-19 DIAGNOSIS — M25612 Stiffness of left shoulder, not elsewhere classified: Secondary | ICD-10-CM | POA: Diagnosis not present

## 2016-09-19 DIAGNOSIS — M25512 Pain in left shoulder: Secondary | ICD-10-CM | POA: Diagnosis not present

## 2016-09-19 DIAGNOSIS — M7502 Adhesive capsulitis of left shoulder: Secondary | ICD-10-CM | POA: Diagnosis not present

## 2016-09-23 DIAGNOSIS — M7502 Adhesive capsulitis of left shoulder: Secondary | ICD-10-CM | POA: Diagnosis not present

## 2016-09-23 DIAGNOSIS — M25612 Stiffness of left shoulder, not elsewhere classified: Secondary | ICD-10-CM | POA: Diagnosis not present

## 2016-09-23 DIAGNOSIS — M25512 Pain in left shoulder: Secondary | ICD-10-CM | POA: Diagnosis not present

## 2016-09-26 DIAGNOSIS — M7502 Adhesive capsulitis of left shoulder: Secondary | ICD-10-CM | POA: Diagnosis not present

## 2016-09-26 DIAGNOSIS — M25512 Pain in left shoulder: Secondary | ICD-10-CM | POA: Diagnosis not present

## 2016-09-26 DIAGNOSIS — M25612 Stiffness of left shoulder, not elsewhere classified: Secondary | ICD-10-CM | POA: Diagnosis not present

## 2016-09-28 ENCOUNTER — Ambulatory Visit (INDEPENDENT_AMBULATORY_CARE_PROVIDER_SITE_OTHER): Payer: BLUE CROSS/BLUE SHIELD | Admitting: Family Medicine

## 2016-09-28 ENCOUNTER — Encounter: Payer: Self-pay | Admitting: Family Medicine

## 2016-09-28 VITALS — BP 107/68 | HR 63 | Temp 97.8°F | Resp 16 | Ht 75.0 in | Wt 202.8 lb

## 2016-09-28 DIAGNOSIS — R7303 Prediabetes: Secondary | ICD-10-CM

## 2016-09-28 LAB — POCT GLYCOSYLATED HEMOGLOBIN (HGB A1C): Hemoglobin A1C: 5.9

## 2016-09-28 NOTE — Progress Notes (Signed)
OFFICE VISIT  09/28/2016   CC:  Chief Complaint  Patient presents with  . Follow-up    Prediabetes, pt is not fasting.   HPI:    Patient is a 56 y.o. Caucasian male who presents for 6 mo f/u prediabetes. Has been making dietary changes, getting back into exercise now. Recently had left shoulder surgery 08/01/16 for calcific bursitis, bone spurs, distal clavicle excision.  Went through PT well. Feeling much better and has gotten back into swimming.  Feeling well, no acute complaints.   Past Medical History:  Diagnosis Date  . Adenomatous colon polyp 2011; 03/2015  . Basal cell carcinoma   . 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 Diverticulosis   . Hyperlipidemia 2012   Diet/exercise x 73mo improved this.  . IFG (impaired fasting glucose) 02/15/2016   HbA1c 6.1%  . Left shoulder pain 02/2016   ? calcific bursitis: Dr. Bing Neighbors NSAIDs.  Subacromial bursitis/impingement syndrome dx'd at f/u with Dr. Lavonna Rua injection given.  No improvement: MRI showed small insertional RC tear and adhesive capsulitis--steroid injection done again, PT and steroid injections helping as of 06/22/16.  . Low back pain     Past Surgical History:  Procedure Laterality Date  . BASAL CELL CARCINOMA EXCISION    . COLONOSCOPY W/ POLYPECTOMY  10/2009; 03/2015   2011 had 1 polyp, 2016 had 2 polyps: recall 5 yrs  . LUMBAR [place] SURGERY  03/2010   L4-5    Outpatient Medications Prior to Visit  Medication Sig Dispense Refill  . atorvastatin (LIPITOR) 10 MG tablet Take 1 tablet (10 mg total) by mouth daily. 90 tablet 1  . diclofenac (VOLTAREN) 75 MG EC tablet Take 1 tablet (75 mg total) by mouth 2 (two) times daily. (Patient not taking: Reported on 09/28/2016) 60 tablet 1  . naproxen sodium (ANAPROX) 220 MG tablet Take 220 mg by mouth 2 (two) times daily as needed.      No facility-administered medications prior to visit.     No Known Allergies  ROS As per  HPI  PE: Blood pressure 107/68, pulse 63, temperature 97.8 F (36.6 C), temperature source Oral, resp. rate 16, height 6\' 3"  (1.905 m), weight 202 lb 12 oz (92 kg), SpO2 98 %. Gen: Alert, well appearing.  Patient is oriented to person, place, time, and situation. AFFECT: pleasant, lucid thought and speech. No further exam today.  LABS:  Lab Results  Component Value Date   TSH 2.71 02/15/2016   Lab Results  Component Value Date   WBC 6.0 02/15/2016   HGB 14.5 02/15/2016   HCT 43 02/15/2016   PLT 219 02/15/2016   Lab Results  Component Value Date   CREATININE 1.1 02/15/2016   BUN 17 02/15/2016   NA 139 02/15/2016   K 5.0 02/15/2016   CL 106 03/25/2014   CO2 26 03/25/2014   Lab Results  Component Value Date   ALT 23 02/15/2016   AST 16 02/15/2016   ALKPHOS 60 02/15/2016   BILITOT 1.2 03/25/2014   Lab Results  Component Value Date   CHOL 175 02/15/2016   Lab Results  Component Value Date   HDL 49 02/15/2016   Lab Results  Component Value Date   LDLCALC 102 02/15/2016   Lab Results  Component Value Date   TRIG 122 02/15/2016   Lab Results  Component Value Date   CHOLHDL 3 03/31/2015   Lab Results  Component Value Date   PSA  0.9 02/15/2016   Lab Results  Component Value Date   HGBA1C 5.9 09/28/2016   POC HbA1c today 5.9%  IMPRESSION AND PLAN:  Prediabetes: HbA1c slightly improved (5.9%) compared to 7 mo ago. Continue TLC. Will monitor A1c again in 6 mo when he returns for CPE.   An After Visit Summary was printed and given to the patient.  FOLLOW UP: Return in about 6 months (around 03/30/2017) for annual CPE (fasting).  Signed:  Crissie Sickles, MD           09/28/2016

## 2016-09-29 DIAGNOSIS — M7502 Adhesive capsulitis of left shoulder: Secondary | ICD-10-CM | POA: Diagnosis not present

## 2016-09-29 DIAGNOSIS — M25512 Pain in left shoulder: Secondary | ICD-10-CM | POA: Diagnosis not present

## 2016-09-29 DIAGNOSIS — M25612 Stiffness of left shoulder, not elsewhere classified: Secondary | ICD-10-CM | POA: Diagnosis not present

## 2016-10-13 DIAGNOSIS — M7502 Adhesive capsulitis of left shoulder: Secondary | ICD-10-CM | POA: Diagnosis not present

## 2016-10-13 DIAGNOSIS — M25612 Stiffness of left shoulder, not elsewhere classified: Secondary | ICD-10-CM | POA: Diagnosis not present

## 2016-10-13 DIAGNOSIS — M25512 Pain in left shoulder: Secondary | ICD-10-CM | POA: Diagnosis not present

## 2016-10-17 DIAGNOSIS — M25612 Stiffness of left shoulder, not elsewhere classified: Secondary | ICD-10-CM | POA: Diagnosis not present

## 2016-10-17 DIAGNOSIS — M25512 Pain in left shoulder: Secondary | ICD-10-CM | POA: Diagnosis not present

## 2016-10-17 DIAGNOSIS — M7502 Adhesive capsulitis of left shoulder: Secondary | ICD-10-CM | POA: Diagnosis not present

## 2016-10-24 ENCOUNTER — Ambulatory Visit (INDEPENDENT_AMBULATORY_CARE_PROVIDER_SITE_OTHER): Payer: BLUE CROSS/BLUE SHIELD

## 2016-10-24 DIAGNOSIS — M7502 Adhesive capsulitis of left shoulder: Secondary | ICD-10-CM | POA: Diagnosis not present

## 2016-10-24 DIAGNOSIS — Z23 Encounter for immunization: Secondary | ICD-10-CM | POA: Diagnosis not present

## 2016-10-24 DIAGNOSIS — M25512 Pain in left shoulder: Secondary | ICD-10-CM | POA: Diagnosis not present

## 2016-10-24 DIAGNOSIS — M25612 Stiffness of left shoulder, not elsewhere classified: Secondary | ICD-10-CM | POA: Diagnosis not present

## 2016-10-24 NOTE — Progress Notes (Signed)
Patient presents today for Shingrix injection #2. Given with no incidence or problems. Patient left with no complaints. 

## 2016-10-27 DIAGNOSIS — M25512 Pain in left shoulder: Secondary | ICD-10-CM | POA: Diagnosis not present

## 2016-10-27 DIAGNOSIS — M25612 Stiffness of left shoulder, not elsewhere classified: Secondary | ICD-10-CM | POA: Diagnosis not present

## 2016-10-27 DIAGNOSIS — M7502 Adhesive capsulitis of left shoulder: Secondary | ICD-10-CM | POA: Diagnosis not present

## 2016-11-02 NOTE — Progress Notes (Signed)
Agree with injection of shingrix #2 in office today.  Signed:  Crissie Sickles, MD           11/02/2016

## 2017-02-04 ENCOUNTER — Other Ambulatory Visit: Payer: Self-pay | Admitting: Family Medicine

## 2017-02-09 ENCOUNTER — Ambulatory Visit (INDEPENDENT_AMBULATORY_CARE_PROVIDER_SITE_OTHER): Payer: BLUE CROSS/BLUE SHIELD

## 2017-02-09 DIAGNOSIS — Z23 Encounter for immunization: Secondary | ICD-10-CM | POA: Diagnosis not present

## 2017-03-08 ENCOUNTER — Encounter: Payer: Self-pay | Admitting: Family Medicine

## 2017-03-08 ENCOUNTER — Other Ambulatory Visit: Payer: Self-pay

## 2017-03-08 ENCOUNTER — Ambulatory Visit (INDEPENDENT_AMBULATORY_CARE_PROVIDER_SITE_OTHER): Payer: BLUE CROSS/BLUE SHIELD | Admitting: Family Medicine

## 2017-03-08 VITALS — BP 114/70 | HR 51 | Temp 97.4°F | Resp 16 | Ht 75.0 in | Wt 201.5 lb

## 2017-03-08 DIAGNOSIS — R7301 Impaired fasting glucose: Secondary | ICD-10-CM | POA: Diagnosis not present

## 2017-03-08 DIAGNOSIS — N182 Chronic kidney disease, stage 2 (mild): Secondary | ICD-10-CM | POA: Diagnosis not present

## 2017-03-08 DIAGNOSIS — E7800 Pure hypercholesterolemia, unspecified: Secondary | ICD-10-CM | POA: Diagnosis not present

## 2017-03-08 DIAGNOSIS — Z125 Encounter for screening for malignant neoplasm of prostate: Secondary | ICD-10-CM

## 2017-03-08 DIAGNOSIS — Z0000 Encounter for general adult medical examination without abnormal findings: Secondary | ICD-10-CM | POA: Diagnosis not present

## 2017-03-08 LAB — COMPREHENSIVE METABOLIC PANEL
ALK PHOS: 57 U/L (ref 39–117)
ALT: 18 U/L (ref 0–53)
AST: 23 U/L (ref 0–37)
Albumin: 4.4 g/dL (ref 3.5–5.2)
BILIRUBIN TOTAL: 1.1 mg/dL (ref 0.2–1.2)
BUN: 18 mg/dL (ref 6–23)
CALCIUM: 9.4 mg/dL (ref 8.4–10.5)
CO2: 30 meq/L (ref 19–32)
Chloride: 103 mEq/L (ref 96–112)
Creatinine, Ser: 0.95 mg/dL (ref 0.40–1.50)
GFR: 86.59 mL/min (ref >60.00)
GLUCOSE: 101 mg/dL — AB (ref 70–99)
POTASSIUM: 4.4 meq/L (ref 3.5–5.1)
Sodium: 139 mEq/L (ref 135–145)
TOTAL PROTEIN: 7.3 g/dL (ref 6.0–8.3)

## 2017-03-08 LAB — CBC WITH DIFFERENTIAL/PLATELET
Basophils Absolute: 0.1 10*3/uL (ref 0.0–0.1)
Basophils Relative: 1.3 % (ref 0.0–3.0)
EOS PCT: 2.3 % (ref 0.0–5.0)
Eosinophils Absolute: 0.1 10*3/uL (ref 0.0–0.7)
HEMATOCRIT: 43.7 % (ref 39.0–52.0)
Hemoglobin: 14.4 g/dL (ref 13.0–17.0)
LYMPHS ABS: 1.8 10*3/uL (ref 0.7–4.0)
LYMPHS PCT: 37.8 % (ref 12.0–46.0)
MCHC: 32.9 g/dL (ref 30.0–36.0)
MCV: 97.2 fl (ref 78.0–100.0)
MONOS PCT: 7.7 % (ref 3.0–12.0)
Monocytes Absolute: 0.4 10*3/uL (ref 0.1–1.0)
NEUTROS ABS: 2.4 10*3/uL (ref 1.4–7.7)
NEUTROS PCT: 50.9 % (ref 43.0–77.0)
PLATELETS: 213 10*3/uL (ref 150.0–400.0)
RBC: 4.5 Mil/uL (ref 4.22–5.81)
RDW: 13.4 % (ref 11.5–15.5)
WBC: 4.6 10*3/uL (ref 4.0–10.5)

## 2017-03-08 LAB — LIPID PANEL
Cholesterol: 146 mg/dL (ref 0–200)
HDL: 55.3 mg/dL (ref >39.00)
LDL Cholesterol: 80 mg/dL (ref 0–99)
NONHDL: 90.87
TRIGLYCERIDES: 56 mg/dL (ref 0.0–149.0)
Total CHOL/HDL Ratio: 3
VLDL: 11.2 mg/dL (ref 0.0–40.0)

## 2017-03-08 LAB — PSA: PSA: 1.15 ng/mL (ref 0.10–4.00)

## 2017-03-08 LAB — HEMOGLOBIN A1C: Hgb A1c MFr Bld: 6.3 % (ref 4.6–6.5)

## 2017-03-08 NOTE — Patient Instructions (Signed)

## 2017-03-08 NOTE — Progress Notes (Signed)
Office Note 03/08/2017  CC:  Chief Complaint  Patient presents with  . Annual Exam    Pt is fasting.     HPI:  Thomas Hubbard is a 56 y.o. White male who is here for annual health maintenance exam.  Exercising: runs 4 days per week, swims 2 days per week. Eyes/dental: screenings UTD. Diet: healthy  Past Medical History:  Diagnosis Date  . Adenomatous colon polyp 2011; 03/2015  . Basal cell carcinoma   . 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 Diverticulosis   . Hyperlipidemia 2012   Diet/exercise x 71mo improved this.  . IFG (impaired fasting glucose) 02/15/2016   HbA1c 6.1%  . Left shoulder pain 02/2016   ? calcific bursitis: Dr. Bing Neighbors NSAIDs.  Subacromial bursitis/impingement syndrome dx'd at f/u with Dr. Lavonna Rua injection given.  No improvement: MRI showed small insertional RC tear and adhesive capsulitis--steroid injection done again, PT and steroid injections helping as of 06/22/16.  . Low back pain     Past Surgical History:  Procedure Laterality Date  . BASAL CELL CARCINOMA EXCISION    . COLONOSCOPY W/ POLYPECTOMY  10/2009; 03/2015   2011 had 1 polyp, 2016 had 2 polyps: recall 5 yrs  . LUMBAR DISC SURGERY  03/2010   L4-5  . SHOULDER SURGERY Left 07/2016   Lysis of adhesions (calcific bursitis), bone spur removal, distal clavicle excision.    Family History  Problem Relation Age of Onset  . Cancer Mother 26       Colon  . Hyperlipidemia Father     Social History   Socioeconomic History  . Marital status: Married    Spouse name: Not on file  . Number of children: 3  . Years of education: Not on file  . Highest education level: Not on file  Social Needs  . Financial resource strain: Not on file  . Food insecurity - worry: Not on file  . Food insecurity - inability: Not on file  . Transportation needs - medical: Not on file  . Transportation needs - non-medical: Not on file  Occupational History  .  Occupation: Banker: [employer]  Tobacco Use  . Smoking status: Never Smoker  . Smokeless tobacco: Never Used  Substance and Sexual Activity  . Alcohol use: No  . Drug use: No  . Sexual activity: Not on file  Other Topics Concern  . Not on file  Social History Narrative   Married, 3 grown children.   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      Competetive swimmer for many years, still swims as his primary form of exercise.      Originally from [place] area, big [employer]    Outpatient Medications Prior to Visit  Medication Sig Dispense Refill  . atorvastatin (LIPITOR) 10 MG tablet TAKE 1 TABLET BY MOUTH EVERY DAY 90 tablet 0   No facility-administered medications prior to visit.     No Known Allergies  ROS Review of Systems  Constitutional: Negative for appetite change, chills, fatigue and fever.  HENT: Negative for congestion, dental problem, ear pain and sore throat.   Eyes: Negative for discharge, redness and visual disturbance.  Respiratory: Negative for cough, chest tightness, shortness of breath and wheezing.   Cardiovascular: Negative for chest pain, palpitations and leg swelling.  Gastrointestinal: Negative for abdominal pain, blood in stool, diarrhea, nausea and vomiting.  Genitourinary: Negative for difficulty  urinating, dysuria, flank pain, frequency, hematuria and urgency.  Musculoskeletal: Negative for arthralgias, back pain, joint swelling, myalgias and neck stiffness.  Skin: Negative for pallor and rash.  Neurological: Negative for dizziness, speech difficulty, weakness and headaches.  Hematological: Negative for adenopathy. Does not bruise/bleed easily.  Psychiatric/Behavioral: Negative for confusion and sleep disturbance. The patient is not nervous/anxious.     PE; Blood pressure 114/70, pulse (!) 51, temperature (!) 97.4 F (36.3 C), temperature source Oral, resp. rate 16, height 6\' 3"  (1.905 m),  weight 201 lb 8 oz (91.4 kg), SpO2 99 %. Gen: Alert, well appearing.  Patient is oriented to person, place, time, and situation. AFFECT: pleasant, lucid thought and speech. ENT: Ears: EACs clear, normal epithelium.  TMs with good light reflex and landmarks bilaterally.  Eyes: no injection, icteris, swelling, or exudate.  EOMI, PERRLA. Nose: no drainage or turbinate edema/swelling.  No injection or focal lesion.  Mouth: lips without lesion/swelling.  Oral mucosa pink and moist.  Dentition intact and without obvious caries or gingival swelling.  Oropharynx without erythema, exudate, or swelling.  Neck: supple/nontender.  No LAD, mass, or TM.  Carotid pulses 2+ bilaterally, without bruits. CV: RRR, no m/r/g.   LUNGS: CTA bilat, nonlabored resps, good aeration in all lung fields. ABD: soft, NT, ND, BS normal.  No hepatospenomegaly or mass.  No bruits. EXT: no clubbing, cyanosis, or edema.  Musculoskeletal: no joint swelling, erythema, warmth, or tenderness.  ROM of all joints intact. Skin - no sores or suspicious lesions or rashes or color changes Rectal exam: negative without mass, lesions or tenderness, PROSTATE EXAM: smooth and symmetric without nodules or tenderness.   Pertinent labs:  Lab Results  Component Value Date   TSH 2.71 02/15/2016   Lab Results  Component Value Date   WBC 6.0 02/15/2016   HGB 14.5 02/15/2016   HCT 43 02/15/2016   PLT 219 02/15/2016   Lab Results  Component Value Date   CREATININE 1.1 02/15/2016   BUN 17 02/15/2016   NA 139 02/15/2016   K 5.0 02/15/2016   CL 106 03/25/2014   CO2 26 03/25/2014   Lab Results  Component Value Date   ALT 23 02/15/2016   AST 16 02/15/2016   ALKPHOS 60 02/15/2016   BILITOT 1.2 03/25/2014   Lab Results  Component Value Date   CHOL 175 02/15/2016   Lab Results  Component Value Date   HDL 49 02/15/2016   Lab Results  Component Value Date   LDLCALC 102 02/15/2016   Lab Results  Component Value Date   TRIG 122  02/15/2016   Lab Results  Component Value Date   CHOLHDL 3 03/31/2015   Lab Results  Component Value Date   PSA 0.9 02/15/2016   Lab Results  Component Value Date   HGBA1C 5.9 09/28/2016    ASSESSMENT AND PLAN:   Health maintenance exam: Reviewed age and gender appropriate health maintenance issues (prudent diet, regular exercise, health risks of tobacco and excessive alcohol, use of seatbelts, fire alarms in home, use of sunscreen).  Also reviewed age and gender appropriate health screening as well as vaccine recommendations. Vaccines: all UTD, including flu.  Shingrix discussed--UTD. Labs: CBC, CMET, FLP, A1c. Prostate ca screening: DRE normal , PSA today. Colon ca screening: next colonoscopy due 03/2020.  An After Visit Summary was printed and given to the patient.  FOLLOW UP:  Return in about 6 months (around 09/06/2017) for routine chronic illness f/u.  Signed:  Crissie Sickles, MD  03/08/2017   

## 2017-03-09 ENCOUNTER — Encounter: Payer: Self-pay | Admitting: Family Medicine

## 2017-04-04 DIAGNOSIS — C4491 Basal cell carcinoma of skin, unspecified: Secondary | ICD-10-CM

## 2017-04-04 HISTORY — DX: Basal cell carcinoma of skin, unspecified: C44.91

## 2017-04-06 DIAGNOSIS — Z808 Family history of malignant neoplasm of other organs or systems: Secondary | ICD-10-CM | POA: Diagnosis not present

## 2017-04-06 DIAGNOSIS — D485 Neoplasm of uncertain behavior of skin: Secondary | ICD-10-CM | POA: Diagnosis not present

## 2017-04-06 DIAGNOSIS — D225 Melanocytic nevi of trunk: Secondary | ICD-10-CM | POA: Diagnosis not present

## 2017-04-06 DIAGNOSIS — D1801 Hemangioma of skin and subcutaneous tissue: Secondary | ICD-10-CM | POA: Diagnosis not present

## 2017-04-06 DIAGNOSIS — L814 Other melanin hyperpigmentation: Secondary | ICD-10-CM | POA: Diagnosis not present

## 2017-04-06 DIAGNOSIS — L570 Actinic keratosis: Secondary | ICD-10-CM | POA: Diagnosis not present

## 2017-04-09 ENCOUNTER — Encounter: Payer: Self-pay | Admitting: Family Medicine

## 2017-04-20 DIAGNOSIS — C44519 Basal cell carcinoma of skin of other part of trunk: Secondary | ICD-10-CM | POA: Diagnosis not present

## 2017-04-27 ENCOUNTER — Encounter: Payer: Self-pay | Admitting: Family Medicine

## 2017-04-27 ENCOUNTER — Ambulatory Visit: Payer: BLUE CROSS/BLUE SHIELD | Admitting: Family Medicine

## 2017-04-27 VITALS — BP 133/80 | HR 56 | Temp 97.6°F | Wt 202.0 lb

## 2017-04-27 DIAGNOSIS — G5621 Lesion of ulnar nerve, right upper limb: Secondary | ICD-10-CM

## 2017-04-27 DIAGNOSIS — R7303 Prediabetes: Secondary | ICD-10-CM | POA: Diagnosis not present

## 2017-04-27 NOTE — Patient Instructions (Signed)
Mediterranean Diet A Mediterranean diet refers to food and lifestyle choices that are based on the traditions of countries located on the [place]. This way of eating has been shown to help prevent certain conditions and improve outcomes for people who have chronic diseases, like kidney disease and heart disease. What are tips for following this plan? Lifestyle  Cook and eat meals together with your family, when possible.  Drink enough fluid to keep your urine clear or pale yellow.  Be physically active every day. This includes: ? Aerobic exercise like running or swimming. ? Leisure activities like gardening, walking, or housework.  Get 7-8 hours of sleep each night.  If recommended by your health care provider, drink red wine in moderation. This means 1 glass a day for nonpregnant women and 2 glasses a day for men. A glass of wine equals 5 oz (150 mL). Reading food labels  Check the serving size of packaged foods. For foods such as rice and pasta, the serving size refers to the amount of cooked product, not dry.  Check the total fat in packaged foods. Avoid foods that have saturated fat or trans fats.  Check the ingredients list for added sugars, such as corn syrup. Shopping  At the grocery store, buy most of your food from the areas near the walls of the store. This includes: ? Fresh fruits and vegetables (produce). ? Grains, beans, nuts, and seeds. Some of these may be available in unpackaged forms or large amounts (in bulk). ? Fresh seafood. ? Poultry and eggs. ? Low-fat dairy products.  Buy whole ingredients instead of prepackaged foods.  Buy fresh fruits and vegetables in-season from local farmers markets.  Buy frozen fruits and vegetables in resealable bags.  If you do not have access to quality fresh seafood, buy precooked frozen shrimp or canned fish, such as tuna, salmon, or sardines.  Buy small amounts of raw or cooked vegetables, salads, or olives from the  deli or salad bar at your store.  Stock your pantry so you always have certain foods on hand, such as olive oil, canned tuna, canned tomatoes, rice, pasta, and beans. Cooking  Cook foods with extra-virgin olive oil instead of using butter or other vegetable oils.  Have meat as a side dish, and have vegetables or grains as your main dish. This means having meat in small portions or adding small amounts of meat to foods like pasta or stew.  Use beans or vegetables instead of meat in common dishes like chili or lasagna.  Experiment with different cooking methods. Try roasting or broiling vegetables instead of steaming or sauteing them.  Add frozen vegetables to soups, stews, pasta, or rice.  Add nuts or seeds for added healthy fat at each meal. You can add these to yogurt, salads, or vegetable dishes.  Marinate fish or vegetables using olive oil, lemon juice, garlic, and fresh herbs. Meal planning  Plan to eat 1 vegetarian meal one day each week. Try to work up to 2 vegetarian meals, if possible.  Eat seafood 2 or more times a week.  Have healthy snacks readily available, such as: ? Vegetable sticks with hummus. ? Greek yogurt. ? Fruit and nut trail mix.  Eat balanced meals throughout the week. This includes: ? Fruit: 2-3 servings a day ? Vegetables: 4-5 servings a day ? Low-fat dairy: 2 servings a day ? Fish, poultry, or lean meat: 1 serving a day ? Beans and legumes: 2 or more servings a week ? Nuts   and seeds: 1-2 servings a day ? Whole grains: 6-8 servings a day ? Extra-virgin olive oil: 3-4 servings a day  Limit red meat and sweets to only a few servings a month What are my food choices?  Mediterranean diet ? Recommended ? Grains: Whole-grain pasta. Brown rice. Bulgar wheat. Polenta. Couscous. Whole-wheat bread. [place]. ? Vegetables: Artichokes. Beets. Broccoli. Cabbage. Carrots. Eggplant. Green beans. Chard. Kale. Spinach. Onions. Leeks. Peas. Squash.  Tomatoes. Peppers. Radishes. ? Fruits: Apples. Apricots. Avocado. Berries. Bananas. Cherries. Dates. Figs. Grapes. Lemons. Melon. Oranges. Peaches. Plums. Pomegranate. ? Meats and other protein foods: Beans. Almonds. Sunflower seeds. Pine nuts. Peanuts. [place]. Salmon. Scallops. Shrimp. [place]. Tilapia. Clams. Oysters. Eggs. ? Dairy: Low-fat milk. Cheese. Greek yogurt. ? Beverages: Water. Red wine. Herbal tea. ? Fats and oils: Extra virgin olive oil. Avocado oil. Grape seed oil. ? Sweets and desserts: [place] yogurt with honey. Baked apples. Poached pears. Trail mix. ? Seasoning and other foods: Basil. Cilantro. Coriander. Cumin. Mint. Parsley. Sage. Rosemary. Tarragon. Garlic. Oregano. Thyme. Pepper. Balsalmic vinegar. Tahini. Hummus. Tomato sauce. Olives. Mushrooms. ? Limit these ? Grains: Prepackaged pasta or rice dishes. Prepackaged cereal with added sugar. ? Vegetables: Deep fried potatoes (french fries). ? Fruits: Fruit canned in syrup. ? Meats and other protein foods: Beef. Pork. Lamb. Poultry with skin. Hot dogs. [place] Salines. ? Dairy: Ice cream. Sour cream. Whole milk. ? Beverages: Juice. Sugar-sweetened soft drinks. Beer. Liquor and spirits. ? Fats and oils: Butter. Canola oil. Vegetable oil. Beef fat (tallow). Lard. ? Sweets and desserts: Cookies. Cakes. Pies. Candy. ? Seasoning and other foods: Mayonnaise. Premade sauces and marinades. ? The items listed may not be a complete list. Talk with your dietitian about what dietary choices are right for you. Summary  The Mediterranean diet includes both food and lifestyle choices.  Eat a variety of fresh fruits and vegetables, beans, nuts, seeds, and whole grains.  Limit the amount of red meat and sweets that you eat.  Talk with your health care provider about whether it is safe for you to drink red wine in moderation. This means 1 glass a day for nonpregnant women and 2 glasses a day for men. A glass of wine equals 5 oz (150 mL). This information  is not intended to replace advice given to you by your health care provider. Make sure you discuss any questions you have with your health care provider. Document Released: 11/12/2015 Document Revised: 12/15/2015 Document Reviewed: 11/12/2015 Elsevier Interactive Patient Education  2018 [employer] Prediabetes-also called impaired glucose tolerance or impaired fasting glucose-is a condition that causes blood sugar (blood glucose) levels to be higher than normal. Following a healthy diet can help to keep prediabetes under control. It can also help to lower the risk of type 2 diabetes and heart disease, which are increased in people who have prediabetes. Along with regular exercise, a healthy diet:  Promotes weight loss.  Helps to control blood sugar levels.  Helps to improve the way that the body uses insulin.  What do I need to know about this eating plan?  Use the glycemic index (GI) to plan your meals. The index tells you how quickly a food will raise your blood sugar. Choose low-GI foods. These foods take a longer time to raise blood sugar.  Pay close attention to the amount of carbohydrates in the food that you eat. Carbohydrates increase blood sugar levels.  Keep track of how many calories you take in. Eating the right amount of  calories will help you to achieve a healthy weight. Losing about 7 percent of your starting weight can help to prevent type 2 diabetes.  You may want to follow a Mediterranean diet. This diet includes a lot of vegetables, lean meats or fish, whole grains, fruits, and healthy oils and fats. What foods can I eat? Grains Whole grains, such as whole-wheat or whole-grain breads, crackers, cereals, and pasta. Unsweetened oatmeal. Bulg[place]. Barley. Quinoa. Brown rice. Corn or whole-wheat flour tortillas or taco shells. Vegetables Lettuce. Spinach. Peas. Beets. Cauliflower. Cabbage. Broccoli. Carrots. Tomatoes. Squash. Eggplant. Herbs. Peppers.  Onions. Cucumbers. Brussels sprouts. Fruits Berries. Bananas. Apples. Oranges. Grapes. Papaya. Mango. Pomegranate. Kiwi. Grapefruit. Cherries. Meats and Other Protein Sources Seafood. Lean meats, such as chicken and [place] or lean cuts of pork and beef. Tofu. Eggs. Nuts. Beans. Dairy Low-fat or fat-free dairy products, such as yogurt, cottage cheese, and cheese. Beverages Water. Tea. Coffee. Sugar-free or diet soda. Seltzer water. Milk. Milk alternatives, such as soy or almond milk. Condiments Mustard. Relish. Low-fat, low-sugar ketchup. Low-fat, low-sugar barbecue sauce. Low-fat or fat-free mayonnaise. Sweets and Desserts Sugar-free or low-fat pudding. Sugar-free or low-fat ice cream and other frozen treats. Fats and Oils Avocado. Walnuts. Olive oil. The items listed above may not be a complete list of recommended foods or beverages. Contact your dietitian for more options. What foods are not recommended? Grains Refined white flour and flour products, such as bread, pasta, snack foods, and cereals. Beverages Sweetened drinks, such as sweet iced tea and soda. Sweets and Desserts Baked goods, such as cake, cupcakes, pastries, cookies, and cheesecake. The items listed above may not be a complete list of foods and beverages to avoid. Contact your dietitian for more information. This information is not intended to replace advice given to you by your health care provider. Make sure you discuss any questions you have with your health care provider. Document Released: 08/05/2014 Document Revised: 08/27/2015 Document Reviewed: 04/16/2014 Elsevier Interactive Patient Education  2017 [employer].

## 2017-04-27 NOTE — Progress Notes (Signed)
OFFICE VISIT  04/27/2017   CC:  Chief Complaint  Patient presents with  . Wrist Pain    Right wrist pain, discuss prediabetes   HPI:    Patient is a 57 y.o. Caucasian male who presents for right wrist pain as well as discussion about recent labs indicating worsening prediabetes.   Three to four weeks ago noted mild ache in wrist on radial side and extends up forearm some (about 6 inches), made worse with supination and pronation.  Flexion and extension of wrist don't hurt it.  Thumb and finger movements don't hurt it.  No paresthesias.  Seems to be getting worse. Has not rested it in any way other than avoiding a few household tasks.  No redness of skin.   No acute strain or change in habit occurred prior.  Aleve 1 bid helps. Since having L shoulder surgery 07/2016 he has adjusted his sleeping position so that he avoids lying on the L shoulder. He bends his R arm at the elbow and sleeps more on top of it or on L side with arm positioned this way.  We reviewed recent labs, weights, diet/exercise habits. We discussed the general dx of prediabetes, chances of progression to diabetes, etc.  Past Medical History:  Diagnosis Date  . Adenomatous colon polyp 2011; 03/2015  . Basal cell carcinoma 04/2017   L scapula  . 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 Diverticulosis   . Hyperlipidemia 2012   Diet/exercise x 14mo improved this.  . IFG (impaired fasting glucose) 02/15/2016   HbA1c 6.1%.  A1c 6.3% 03/2017--discussed possible start of metformin and pt to research and get back to me.  . Left shoulder pain 02/2016   ? calcific bursitis: Dr. Bing Neighbors NSAIDs.  Subacromial bursitis/impingement syndrome dx'd at f/u with Dr. Lavonna Rua injection given.  No improvement: MRI showed small insertional RC tear and adhesive capsulitis--steroid injection done again, PT and steroid injections helping as of 06/22/16.  . Low back pain     Past Surgical  History:  Procedure Laterality Date  . BASAL CELL CARCINOMA EXCISION    . COLONOSCOPY W/ POLYPECTOMY  10/2009; 03/2015   2011 had 1 polyp, 2016 had 2 polyps: recall 5 yrs  . LUMBAR DISC SURGERY  03/2010   L4-5  . SHOULDER SURGERY Left 07/2016   Lysis of adhesions (calcific bursitis), bone spur removal, distal clavicle excision.    Outpatient Medications Prior to Visit  Medication Sig Dispense Refill  . atorvastatin (LIPITOR) 10 MG tablet TAKE 1 TABLET BY MOUTH EVERY DAY 90 tablet 0   No facility-administered medications prior to visit.     No Known Allergies  ROS As per HPI  PE: Blood pressure 133/80, pulse (!) 56, temperature 97.6 F (36.4 C), temperature source Oral, weight 202 lb (91.6 kg), SpO2 98 %. Body mass index is 25.25 kg/m.  Gen: Alert, well appearing.  Patient is oriented to person, place, time, and situation. AFFECT: pleasant, lucid thought and speech. Right forearm with mild TTP of soft tissues around distal 1/3 of ulna, without swelling, bony tenderness, or erythema. No wrist tenderness or pain with wrist flexion/extension or inversion or eversion.  NV intact, arm strength normal. Tinel's + at ulnar groove of R elbow. Tinel's and phalen's at right wrist NEG.  LABS:  Lab Results  Component Value Date   HGBA1C 6.3 03/08/2017     Chemistry      Component Value Date/Time   NA  139 03/08/2017 1022   NA 139 02/15/2016   K 4.4 03/08/2017 1022   CL 103 03/08/2017 1022   CO2 30 03/08/2017 1022   BUN 18 03/08/2017 1022   BUN 17 02/15/2016   CREATININE 0.95 03/08/2017 1022   GLU 106 02/15/2016      Component Value Date/Time   CALCIUM 9.4 03/08/2017 1022   ALKPHOS 57 03/08/2017 1022   AST 23 03/08/2017 1022   ALT 18 03/08/2017 1022   BILITOT 1.1 03/08/2017 1022       IMPRESSION AND PLAN:  1) Right ulnar neuropathy: likely related to persistently altered sleeping position (R arm constantly flexed) for the last 8-10 mo due to L shoulder  pain/surgery. Discussed best treatment is to sleep with arm outstretched--position a pillow in crook of elbow or wear elbow sleeve in order to avoid stretch of the ulnar nerve. May continue aleve 220-440mg  bid with food prn.  2) Prediabetes: as stated in HPI, reviewed this topic in general. PLAN: refer to Dr. Boyce Medici, nutritionist, and in the meantime I have given him some literature about prediabetes diet and mediterranean diet to review.  He will work at some dietary adjustments and increase exercise, with a goal of losing some weight---his goal wt is 195 lbs---we discussed that this is the ULN of ideal body weight for his height.   We'll have him return for his next CPE 03/2018 and we'll check on his progress and recheck Hba1c. If A1c unchanged or elevated from most recent one (6.3%), he is in favor of starting metformin.  Spent 25 min with pt today, with >50% of this time spent in counseling and care coordination regarding the above problems.  An After Visit Summary was printed and given to the patient.  FOLLOW UP: Return for keep plan for fasting CPE 03/2018.  Signed:  Crissie Sickles, MD           04/27/2017

## 2017-05-16 ENCOUNTER — Ambulatory Visit: Payer: BLUE CROSS/BLUE SHIELD | Admitting: Family Medicine

## 2017-05-25 ENCOUNTER — Encounter: Payer: Self-pay | Admitting: Family Medicine

## 2017-05-25 ENCOUNTER — Ambulatory Visit (INDEPENDENT_AMBULATORY_CARE_PROVIDER_SITE_OTHER): Payer: BLUE CROSS/BLUE SHIELD | Admitting: Family Medicine

## 2017-05-25 DIAGNOSIS — R7303 Prediabetes: Secondary | ICD-10-CM

## 2017-05-25 NOTE — Patient Instructions (Addendum)
  Diet Recommendations for Pre-diabetes  Carbohydrate includes starch, sugar, and fiber.  Of these, only sugar and starch raise blood glucose.  (Fiber is found in fruits, vegetables [especially skin, seeds, and stalks] and whole grains.)   Starchy (carb) foods: Bread, rice, pasta, potatoes, corn, cereal, grits, crackers, bagels, muffins, all baked goods.  (Fruit, milk, and yogurt also have carbohydrate, but most of these foods will not spike your blood sugar as most starchy foods will.)  A few fruits do cause high blood sugars; use small portions of bananas (limit to 1/2 at a time), grapes, watermelon, oranges, and most tropical fruits.   Protein foods: Meat, fish, poultry, eggs, dairy foods, and beans such as pinto and kidney beans (beans also provide carbohydrate).   1. Eat at least REAL 3 meals and 1-2 snacks per day. Never go more than 4-5 hours while awake without eating. Eat breakfast within the first hour of getting up on days you aren't exercising first thing.    2. Limit starchy foods to TWO per meal and ONE per snack. ONE portion of a starchy  food is equal to the following:   - ONE slice of bread (or its equivalent, such as half of a hamburger bun).   - 1/2 cup of a "scoopable" starchy food such as potatoes or rice.   - 15 grams of Total Carbohydrate as shown on food label.  3. Include at every meal: a protein food, a carb food, and vegetables and/or fruit.   - Obtain twice the volume of veg's as protein or carbohydrate foods for both lunch and dinner.   - Fresh or frozen veg's are best.   - Keep frozen veg's on hand for a quick vegetable serving.     -Extra-virgin olive oil is great.  Use it as desired.  This is a great fat, nutritionally speaking.  Keep in mind that all fat, both good and bad, provides a lot of calories.    - For good fiber intake, you may want to consider adding ~1 tbsp of flax seed meal to breakfast cereal or yogurt or cottage cheese.  (Keep regfrigerated.)  - Every  time you workout, have at least one full water bottle (16-24 oz).  Make this a habit.

## 2017-05-25 NOTE — Progress Notes (Signed)
Medical Nutrition Therapy:  Appt start time: 1330 end time:  1430.  Assessment:  Primary concerns today: Blood sugar control.  Zorian would like to avoid having to take any med's to control his BG.  He'd like to weigh ~195 as well.  Two of his three sisters have DM, and his dad developed DM in his late-60s.    Learning Readiness: Change in progress; has tracked his dietary intake, and has been trying to eat more of a Mediterranean diet.  Jerryl enjoys cooking, and he and his wife have agreed to a schedule of Harper cooking dinner 4 X wk, and Collie Siad cooking dinner 3 X wk.    Usual eating pattern includes 3 meals and 2-3 snacks per day. Frequent foods and beverages include water, coffee w/ half sk milk, 1/2 glass wine 3-4 X wk, unsweet tea, avocado&tomato on toast, steel-cut oats w/ cinnamon, Cheerios w/ sk milk, nonfat yogurt w/ fruit, 1 egg with toast, 1/2 c berries; salmon 2 X wk, chx 3 X wk, other lean protein sources, 2-3 veg's w/ dinner (grn beans, car's, peas, Br sprts, asp, broc, cauliflwr), salads, hard cheese.  Avoided foods include most bread, most red or processed meats, soda, whole milk, soft cheeses, highly processed foods, pasta.   Usual physical activity includes running 4-6 mi 4 X wk; swimming 3500-4000 yards twice a week.    24-hr recall: (Up at 7 AM) B (7:50 AM)-   1 whwht toast, 1/4 avocado, tomato slc, 1/2 c blue- and blkberries, 16 oz coffee, water  Snk ( AM)-   --- L (1 PM)-  3-4 spears asparagus, 3/4 c mashed pot's, 1 1/2 c chx&roasted veg stew, water Snk (4 PM)-  1 handful peanuts and raisins, water D (5:30 PM)-  [facility] buffet: flounder w/ crab meat, 1 fried chx fried, 1 c roasted root veg's, toss salad (no drsng), 1/2 c coleslaw, 1 c grn beans, 3 [place] fries, unswt tea, water Snk (8 PM)-  2 oz wine Typical day? No.  Dinner was atypical from recent usual.  Usually has lean protein, limited starch, and plenty of veg's.   Progress Towards Goal(s):  In progress.   Nutritional  Diagnosis: No Nutr dx fits Jaque's current status; he has reduced carb foods, including fruit juices, and has made excellent progress toward a diet that will better manage BG levels.  He needed some additional advice and fine-tuning today, and I expect that he will do well with information provided.      Intervention:  Nutrition education Handouts given during visit include:   After-Visit Summary (AVS)  Demonstrated degree of understanding via:  Teach Back  Barriers to learning/adherence to lifestyle change: None apparent  Monitoring/Evaluation:  Dietary intake, exercise, and body weight prn.  [place] will be in touch in a couple of months.

## 2017-06-02 DIAGNOSIS — M65931 Unspecified synovitis and tenosynovitis, right forearm: Secondary | ICD-10-CM

## 2017-06-02 DIAGNOSIS — M65831 Other synovitis and tenosynovitis, right forearm: Secondary | ICD-10-CM

## 2017-06-02 HISTORY — DX: Unspecified synovitis and tenosynovitis, right forearm: M65.931

## 2017-06-02 HISTORY — DX: Other synovitis and tenosynovitis, right forearm: M65.831

## 2017-06-06 NOTE — Progress Notes (Signed)
[name] [address] [place], [place] [address] Phone: [phone] Subjective:    I'm seeing this patient by the request  of:  McGowen, Adrian Blackwater, MD   CC: right wrist pain   [id number]  Thomas Hubbard is a 57 y.o. male coming in with complaint of right wrist pain. No mechanism of injury. Pain started 6 weeks ago. Pain occurs intermittently, supination and pronation bother him. He swims and his hand hurts with the turn. Pain is on dorsal aspect of hand. Patient has has modified the way he sleeps as he was sleeping with elbow flexed.   Onset- 6 weeks Location- dorsal wrist Duration- intermittent Character- dull Aggravating factors- supination, pronation and extension Reliving factors- not using hand (non-dominant) Therapies tried-changing certain routines throughout the day seems to be helpful. Severity-5 out of 10     Past Medical History:  Diagnosis Date  . Adenomatous colon polyp 2011; 03/2015  . Basal cell carcinoma 04/2017   L scapula  . 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 Diverticulosis   . Hyperlipidemia 2012   Diet/exercise x 62mo improved this.  . IFG (impaired fasting glucose) 02/15/2016   HbA1c 6.1%.  A1c 6.3% 03/2017--discussed possible start of metformin and pt to research and get back to me.  . Left shoulder pain 02/2016   ? calcific bursitis: Dr. Bing Neighbors NSAIDs.  Subacromial bursitis/impingement syndrome dx'd at f/u with Dr. Lavonna Rua injection given.  No improvement: MRI showed small insertional RC tear and adhesive capsulitis--steroid injection done again, PT and steroid injections helping as of 06/22/16.  . Low back pain    Past Surgical History:  Procedure Laterality Date  . BASAL CELL CARCINOMA EXCISION    . COLONOSCOPY W/ POLYPECTOMY  10/2009; 03/2015   2011 had 1 polyp, 2016 had 2 polyps: recall 5 yrs  . LUMBAR DISC SURGERY  03/2010   L4-5  . SHOULDER SURGERY Left  07/2016   Lysis of adhesions (calcific bursitis), bone spur removal, distal clavicle excision.   Social History   Socioeconomic History  . Marital status: Married    Spouse name: None  . Number of children: 3  . Years of education: None  . Highest education level: None  Social Needs  . Financial resource strain: None  . Food insecurity - worry: None  . Food insecurity - inability: None  . Transportation needs - medical: None  . Transportation needs - non-medical: None  Occupational History  . Occupation: Banker: [employer]  Tobacco Use  . Smoking status: Never Smoker  . Smokeless tobacco: Never Used  Substance and Sexual Activity  . Alcohol use: Yes    Alcohol/week: 1.2 oz    Types: 2 Glasses of wine per week  . Drug use: No  . Sexual activity: None  Other Topics Concern  . None  Social History Narrative   Married, 3 grown children.   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      Competetive swimmer for many years, still swims as his primary form of exercise.      Originally from [place] area, big Eagles fan   No Known Allergies Family History  Problem Relation Age of Onset  . Cancer Mother 78       Colon  . Hyperlipidemia Father      Past medical history, social, surgical and family history all reviewed in electronic medical record.  No pertanent information unless stated regarding to the chief complaint.   Review of Systems:Review of systems updated and as accurate as of 06/07/17  No headache, visual changes, nausea, vomiting, diarrhea, constipation, dizziness, abdominal pain, skin rash, fevers, chills, night sweats, weight loss, swollen lymph nodes, body aches, joint swelling,  chest pain, shortness of breath, mood changes.  Positive muscle aches  Objective  Blood pressure 118/86, pulse (!) 53, height 6\' 3"  (1.905 m), weight 202 lb (91.6 kg), SpO2 98 %. Systems examined below as of 06/07/17   General: No  apparent distress alert and oriented x3 mood and affect normal, dressed appropriately.  HEENT: Pupils equal, extraocular movements intact  Respiratory: Patient's speak in full sentences and does not appear short of breath  Cardiovascular: No lower extremity edema, non tender, no erythema  Skin: Warm dry intact with no signs of infection or rash on extremities or on axial skeleton.  Abdomen: Soft nontender  Neuro: Cranial nerves II through XII are intact, neurovascularly intact in all extremities with 2+ DTRs and 2+ pulses.  Lymph: No lymphadenopathy of posterior or anterior cervical chain or axillae bilaterally.  Gait normal with good balance and coordination.  MSK:  Non tender with full range of motion and good stability and symmetric strength and tone of shoulders, elbows,  hip, knee and ankles bilaterally.  Wrist: Right Inspection normal with no visible erythema or swelling. ROM chest patient does have some decreased range of motion in dorsiflexion by approximately 10 degrees. Palpation is normal over metacarpals, navicular, lunate, and TFCC; tendons without tenderness/ swelling No snuffbox tenderness. No tenderness over Canal of Guyon. Strength 5/5 in all directions without pain. Negative Finkelstein, tinel's and phalens. Negative Watson's test. Contralateral wrist unremarkable  MSK US performed of: Right wrist This study was ordered, performed, and interpreted by Charlann Boxer D.O.  Wrist: All extensor compartments visualized third compartment does have a tenosynovitis noted.  Underlying the scaphoid lunate joint patient does have what appears to be a large synovitis but no true cyst noted.  Mild increase in Doppler flow in the area.  No sign of any soft tissue swelling. TFCC intact. Scapholunate ligament intact.   IMPRESSION: What appears to be a tenosynovitis and mild synovitis of the wrist  [address]; 15 additional minutes spent for Therapeutic exercises as stated in above notes.   This included exercises focusing on stretching, strengthening, with significant focus on eccentric aspects.   Long term goals include an improvement in range of motion, strength, endurance as well as avoiding reinjury. Patient's frequency would include in 1-2 times a day, 3-5 times a week for a duration of 6-12 weeks.  Proper technique shown and discussed handout in great detail with ATC.  All questions were discussed and answered.        Impression and Recommendations:     This case required medical decision making of moderate complexity.      Note: This dictation was prepared with Dragon dictation along with smaller phrase technology. Any transcriptional errors that result from this process are unintentional.

## 2017-06-07 ENCOUNTER — Encounter: Payer: Self-pay | Admitting: Family Medicine

## 2017-06-07 ENCOUNTER — Ambulatory Visit: Payer: BLUE CROSS/BLUE SHIELD | Admitting: Family Medicine

## 2017-06-07 ENCOUNTER — Ambulatory Visit: Payer: Self-pay

## 2017-06-07 ENCOUNTER — Other Ambulatory Visit: Payer: Self-pay

## 2017-06-07 VITALS — BP 118/86 | HR 53 | Ht 75.0 in | Wt 202.0 lb

## 2017-06-07 DIAGNOSIS — M65831 Other synovitis and tenosynovitis, right forearm: Secondary | ICD-10-CM | POA: Insufficient documentation

## 2017-06-07 DIAGNOSIS — M65841 Other synovitis and tenosynovitis, right hand: Secondary | ICD-10-CM

## 2017-06-07 DIAGNOSIS — M25531 Pain in right wrist: Secondary | ICD-10-CM

## 2017-06-07 MED ORDER — VITAMIN D (ERGOCALCIFEROL) 1.25 MG (50000 UNIT) PO CAPS: 50000.0000 [IU] | ORAL_CAPSULE | ORAL | 0 refills | Status: DC

## 2017-06-07 MED ORDER — DICLOFENAC SODIUM 2 % TD SOLN: 2.0000 g | Freq: Two times a day (BID) | TRANSDERMAL | 3 refills | Status: DC

## 2017-06-07 NOTE — Patient Instructions (Signed)
Good to see you  Wear brace day and night for 1 week then nightly for 2 weeks OK to come out of it to swim Ice 20 minutes 2 times daily. Usually after activity and before bed. Exercises 3 times a week.  Once weekly vitamin  D for 8 weeks to help with the calcium  pennsaid pinkie amount topically 2 times daily as needed.  See me again in 4 weeks to make sure it resolves.

## 2017-06-07 NOTE — Assessment & Plan Note (Signed)
Abnormal tenosynovitis of the wrist noted with mild synovitis of the underlying joint.  Discussed with patient about potential injection of prednisone.  Wants to try conservative therapy.  Topical anti-inflammatories given, icing regimen, once weekly vitamin D icing regimen.  Avoid repetitive wrist extension.  Discussed ergonomics.  Follow-up again in 4 weeks

## 2017-06-13 ENCOUNTER — Ambulatory Visit: Payer: BLUE CROSS/BLUE SHIELD | Admitting: Family Medicine

## 2017-07-06 ENCOUNTER — Ambulatory Visit: Payer: BLUE CROSS/BLUE SHIELD | Admitting: Family Medicine

## 2017-07-06 ENCOUNTER — Ambulatory Visit (INDEPENDENT_AMBULATORY_CARE_PROVIDER_SITE_OTHER)
Admission: RE | Admit: 2017-07-06 | Discharge: 2017-07-06 | Disposition: A | Discharge status: Home / Self Care | Payer: BLUE CROSS/BLUE SHIELD | Source: Ambulatory Visit | Attending: Family Medicine | Admitting: Family Medicine

## 2017-07-06 ENCOUNTER — Encounter: Payer: Self-pay | Admitting: Family Medicine

## 2017-07-06 VITALS — BP 124/70 | HR 76 | Ht 75.0 in | Wt 202.0 lb

## 2017-07-06 DIAGNOSIS — M25531 Pain in right wrist: Secondary | ICD-10-CM

## 2017-07-06 DIAGNOSIS — M65831 Other synovitis and tenosynovitis, right forearm: Secondary | ICD-10-CM

## 2017-07-06 NOTE — Progress Notes (Signed)
[name] [address] [place], [place] [address] Phone: [phone] Subjective:     CC: Wrist pain follow-up  [id number]  Thomas Hubbard is a 57 y.o. male coming in with complaint of right wrist pain.  Patient was seen previously and was diagnosed with morbid extensor tenosynovitis of the wrist.  Patient states that he is feeling approximately 60% better.  Feels like the swelling is improved but still has some mild limited range of motion.  Denies any injury.  States that it is still very uncomfortable.     Past Medical History:  Diagnosis Date  . Adenomatous colon polyp 2011; 03/2015  . Basal cell carcinoma 04/2017   L scapula  . 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 Diverticulosis   . Extensor tenosynovitis of right wrist 06/2017   Dr. Tamala Julian  . Hyperlipidemia 2012   Diet/exercise x 25mo improved this.  . IFG (impaired fasting glucose) 02/15/2016   HbA1c 6.1%.  A1c 6.3% 03/2017--discussed possible start of metformin and pt to research and get back to me.  . Left shoulder pain 02/2016   ? calcific bursitis: Dr. Bing Neighbors NSAIDs.  Subacromial bursitis/impingement syndrome dx'd at f/u with Dr. Lavonna Rua injection given.  No improvement: MRI showed small insertional RC tear and adhesive capsulitis--steroid injection done again, PT and steroid injections helping as of 06/22/16.  . Low back pain    Past Surgical History:  Procedure Laterality Date  . BASAL CELL CARCINOMA EXCISION    . COLONOSCOPY W/ POLYPECTOMY  10/2009; 03/2015   2011 had 1 polyp, 2016 had 2 polyps: recall 5 yrs  . LUMBAR DISC SURGERY  03/2010   L4-5  . SHOULDER SURGERY Left 07/2016   Lysis of adhesions (calcific bursitis), bone spur removal, distal clavicle excision.   Social History   Socioeconomic History  . Marital status: Married    Spouse name: Not on file  . Number of children: 3  . Years of education: Not on file  . Highest  education level: Not on file  Occupational History  . 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 Financial resource strain: Not on file  . Food insecurity:    Worry: Not on file    Inability: Not on file  . Transportation needs:    Medical: Not on file    Non-medical: Not on file  Tobacco Use  . Smoking status: Never Smoker  . Smokeless tobacco: Never Used  Substance and Sexual Activity  . Alcohol use: Yes    Alcohol/week: 1.2 oz    Types: 2 Glasses of wine per week  . Drug use: No  . Sexual activity: Not on file  Lifestyle  . Physical activity:    Days per week: Not on file    Minutes per session: Not on file  . Stress: Not on file  Relationships  . Social connections:    Talks on phone: Not on file    Gets together: Not on file    Attends religious service: Not on file    Active member of club or organization: Not on file    Attends meetings of clubs or organizations: Not on file    Relationship status: Not on file  Other Topics Concern  . Not on file  Social History Narrative   Married, 3 grown children.   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  Competetive swimmer for many years, still swims as his primary form of exercise.      Originally from [place] area, big Eagles fan   No Known Allergies Family History  Problem Relation Age of Onset  . Cancer Mother 52       Colon  . Hyperlipidemia Father      Past medical history, social, surgical and family history all reviewed in electronic medical record.  No pertanent information unless stated regarding to the chief complaint.   Review of Systems:Review of systems updated and as accurate as of 07/06/17  No headache, visual changes, nausea, vomiting, diarrhea, constipation, dizziness, abdominal pain, skin rash, fevers, chills, night sweats, weight loss, swollen lymph nodes, body aches, joint swelling,  chest pain, shortness of breath, mood changes.   Positive muscle aches  Objective  Blood pressure 124/70, pulse 76, height 6\' 3"  (1.905 m), weight 202 lb (91.6 kg), SpO2 96 %. Systems examined below as of 07/06/17   General: No apparent distress alert and oriented x3 mood and affect normal, dressed appropriately.  HEENT: Pupils equal, extraocular movements intact  Respiratory: Patient's speak in full sentences and does not appear short of breath  Cardiovascular: No lower extremity edema, non tender, no erythema  Skin: Warm dry intact with no signs of infection or rash on extremities or on axial skeleton.  Abdomen: Soft nontender  Neuro: Cranial nerves II through XII are intact, neurovascularly intact in all extremities with 2+ DTRs and 2+ pulses.  Lymph: No lymphadenopathy of posterior or anterior cervical chain or axillae bilaterally.  Gait normal with good balance and coordination.  MSK:  Non tender with full range of motion and good stability and symmetric strength and tone of shoulders, elbows,, hip, knee and ankles bilaterally.  Wrist: Right Inspection normal with no visible erythema or swelling. Range of motion shows the patient does have some difficulty with extension of the wrist limited by 5-7 degrees Palpation is normal over metacarpals, navicular, lunate, and TFCC; tendons without tenderness/ swelling still some mild diffuse tenderness over the dorsal aspect of the wrist No snuffbox tenderness. No tenderness over Canal of Guyon. Strength 5/5 in all directions without pain. Negative Finkelstein, tinel's and phalens. Negative Watson's test. Anterolateral wrist unremarkable    Impression and Recommendations:     This case required medical decision making of moderate complexity.      Note: This dictation was prepared with Dragon dictation along with smaller phrase technology. Any transcriptional errors that result from this process are unintentional.

## 2017-07-06 NOTE — Patient Instructions (Signed)
Good to see you  Xray downstairs I think we will turn the corner soon  Up to you on the brace Continue the exercises Tart cherry extract any dose at night  Turmeric 500mg  daily  Lets give it 5-6 weeks and look at it again

## 2017-07-06 NOTE — Assessment & Plan Note (Signed)
Discussed with patient in great length.  We will continue with icing regimen and home exercises.  We discussed which activities to do which wants to avoid.  Patient will increase activity as tolerated.  Follow-up with me again in 6 weeks.  Symptoms consider injections.  X-rays ordered today to rule out any other bony abnormality.

## 2017-08-10 DIAGNOSIS — L905 Scar conditions and fibrosis of skin: Secondary | ICD-10-CM | POA: Diagnosis not present

## 2017-08-14 ENCOUNTER — Ambulatory Visit: Payer: BLUE CROSS/BLUE SHIELD | Admitting: Family Medicine

## 2017-08-29 NOTE — Progress Notes (Signed)
[facility] [address] [place], [place] [address] Phone: [phone] Subjective:      CC: Wrist pain  [id number]  Rad Gramling is a 57 y.o. male coming in with complaint of wrist pain. Patient feels weaker than last visit. He does not feel any improvement since last visit. Still has pain with pronation and supination.  Patient states that it is tender overall.  Would state very minimal improvement.     Past Medical History:  Diagnosis Date  . Adenomatous colon polyp 2011; 03/2015  . Basal cell carcinoma 04/2017   L scapula  . 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 Diverticulosis   . Extensor tenosynovitis of right wrist 06/2017   Dr. Tamala Julian  . Hyperlipidemia 2012   Diet/exercise x 61mo improved this.  . IFG (impaired fasting glucose) 02/15/2016   HbA1c 6.1%.  A1c 6.3% 03/2017--discussed possible start of metformin and pt to research and get back to me.  . Left shoulder pain 02/2016   ? calcific bursitis: Dr. Bing Neighbors NSAIDs.  Subacromial bursitis/impingement syndrome dx'd at f/u with Dr. Lavonna Rua injection given.  No improvement: MRI showed small insertional RC tear and adhesive capsulitis--steroid injection done again, PT and steroid injections helping as of 06/22/16.  . Low back pain    Past Surgical History:  Procedure Laterality Date  . BASAL CELL CARCINOMA EXCISION    . COLONOSCOPY W/ POLYPECTOMY  10/2009; 03/2015   2011 had 1 polyp, 2016 had 2 polyps: recall 5 yrs  . LUMBAR DISC SURGERY  03/2010   L4-5  . SHOULDER SURGERY Left 07/2016   Lysis of adhesions (calcific bursitis), bone spur removal, distal clavicle excision.   Social History   Socioeconomic History  . Marital status: Married    Spouse name: Not on file  . Number of children: 3  . Years of education: Not on file  . Highest education level: Not on file  Occupational History  . Occupation: Banker:  [employer]  . Financial resource strain: Not on file  . Food insecurity:    Worry: Not on file    Inability: Not on file  . Transportation needs:    Medical: Not on file    Non-medical: Not on file  Tobacco Use  . Smoking status: Never Smoker  . Smokeless tobacco: Never Used  Substance and Sexual Activity  . Alcohol use: Yes    Alcohol/week: 1.2 oz    Types: 2 Glasses of wine per week  . Drug use: No  . Sexual activity: Not on file  Lifestyle  . Physical activity:    Days per week: Not on file    Minutes per session: Not on file  . Stress: Not on file  Relationships  . Social connections:    Talks on phone: Not on file    Gets together: Not on file    Attends religious service: Not on file    Active member of club or organization: Not on file    Attends meetings of clubs or organizations: Not on file    Relationship status: Not on file  Other Topics Concern  . Not on file  Social History Narrative   Married, 3 grown children.   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      Competetive swimmer for many years, still swims as his primary form of exercise.  Originally from [place] area, big Eagles fan   No Known Allergies Family History  Problem Relation Age of Onset  . Cancer Mother 36       Colon  . Hyperlipidemia Father      Past medical history, social, surgical and family history all reviewed in electronic medical record.  No pertanent information unless stated regarding to the chief complaint.   Review of Systems:Review of systems updated and as accurate as of 08/29/17  No headache, visual changes, nausea, vomiting, diarrhea, constipation, dizziness, abdominal pain, skin rash, fevers, chills, night sweats, weight loss, swollen lymph nodes, body aches, joint swelling, muscle aches, chest pain, shortness of breath, mood changes.   Objective  There were no vitals taken for this visit. Systems examined below as of  08/29/17   General: No apparent distress alert and oriented x3 mood and affect normal, dressed appropriately.  HEENT: Pupils equal, extraocular movements intact  Respiratory: Patient's speak in full sentences and does not appear short of breath  Cardiovascular: No lower extremity edema, non tender, no erythema  Skin: Warm dry intact with no signs of infection or rash on extremities or on axial skeleton.  Abdomen: Soft nontender  Neuro: Cranial nerves II through XII are intact, neurovascularly intact in all extremities with 2+ DTRs and 2+ pulses.  Lymph: No lymphadenopathy of posterior or anterior cervical chain or axillae bilaterally.  Gait normal with good balance and coordination.  MSK:  Non tender with full range of motion and good stability and symmetric strength and tone of shoulders, elbows, hip, knee and ankles bilaterally.  Wrist: Right Inspection normal with no visible erythema or swelling. Range of motion lacks the last 10 degrees of supination secondary to discomfort and pain and pain with resisted wrist extension Palpation shows the patient does have tenderness over the third compartment of the dorsal wrist No snuffbox tenderness. No tenderness over Canal of Guyon. Strength 5/5 in all directions without pain. Negative Finkelstein, tinel's and phalens. Negative Watson's test. Contralateral wrist unremarkable  Procedure: Real-time Ultrasound Guided Injection of right extensor tendon sheath of the wrist Device: GE Logiq Q7 Ultrasound guided injection is preferred based studies that show increased duration, increased effect, greater accuracy, decreased procedural pain, increased response rate, and decreased cost with ultrasound guided versus blind injection.  Verbal informed consent obtained.  Time-out conducted.  Noted no overlying erythema, induration, or other signs of local infection.  Skin prepped in a sterile fashion.  Local anesthesia: Topical Ethyl chloride.  With  sterile technique and under real time ultrasound guidance: With a 25-gauge half inch needle was injected with 0.5 cc of 0.5% Marcaine and 0.5 cc of Kenalog 40 mg/mL into the extensor tendon sheath. Completed without difficulty  Pain immediately resolved suggesting accurate placement of the medication.  Advised to call if fevers/chills, erythema, induration, drainage, or persistent bleeding.  Images permanently stored and available for review in the ultrasound unit.  Impression: Technically successful ultrasound guided injection.    Impression and Recommendations:     This case required medical decision making of moderate complexity.      Note: This dictation was prepared with Dragon dictation along with smaller phrase technology. Any transcriptional errors that result from this process are unintentional.

## 2017-08-31 ENCOUNTER — Ambulatory Visit: Payer: BLUE CROSS/BLUE SHIELD | Admitting: Family Medicine

## 2017-08-31 ENCOUNTER — Ambulatory Visit: Payer: Self-pay

## 2017-08-31 ENCOUNTER — Encounter: Payer: Self-pay | Admitting: Family Medicine

## 2017-08-31 VITALS — BP 108/76 | HR 55 | Ht 75.0 in | Wt 203.0 lb

## 2017-08-31 DIAGNOSIS — M25531 Pain in right wrist: Secondary | ICD-10-CM

## 2017-08-31 DIAGNOSIS — M65831 Other synovitis and tenosynovitis, right forearm: Secondary | ICD-10-CM

## 2017-08-31 NOTE — Assessment & Plan Note (Signed)
Patient given injection.  Tolerated the procedure well.  Discussed icing regimen and home exercise.  Discussed which activity to doing which wants to avoid.  Patient is to increase activity as tolerated.  Follow-up with me again 4 weeks.

## 2017-08-31 NOTE — Patient Instructions (Signed)
Good to see you  We tried to inject the tendon today and see how it is going  See me again in 2-3 weeks and if not better may need to inject the TFCC and see how it does

## 2017-09-05 ENCOUNTER — Other Ambulatory Visit: Payer: Self-pay | Admitting: Family Medicine

## 2017-09-06 ENCOUNTER — Encounter: Payer: Self-pay | Admitting: Family Medicine

## 2017-09-06 ENCOUNTER — Ambulatory Visit: Payer: BLUE CROSS/BLUE SHIELD | Admitting: Family Medicine

## 2017-09-06 VITALS — BP 106/59 | HR 97 | Temp 97.7°F | Resp 16 | Ht 75.0 in | Wt 198.4 lb

## 2017-09-06 DIAGNOSIS — E7800 Pure hypercholesterolemia, unspecified: Secondary | ICD-10-CM

## 2017-09-06 DIAGNOSIS — Z1159 Encounter for screening for other viral diseases: Secondary | ICD-10-CM

## 2017-09-06 DIAGNOSIS — R7303 Prediabetes: Secondary | ICD-10-CM

## 2017-09-06 LAB — HEMOGLOBIN A1C: HEMOGLOBIN A1C: 6.2 % (ref 4.6–6.5)

## 2017-09-06 NOTE — Progress Notes (Signed)
OFFICE VISIT  09/06/2017   CC:  Chief Complaint  Patient presents with  . Follow-up    RCI, pt is fasting.    HPI:    Patient is a 57 y.o. Caucasian male who presents for f/u hypercholesterolemia, prediabetes, CRI with GFR 60s. Has been seeing nutritionist, Iver Nestle. Has been working on diet well, lost about 5 lbs since last f/u. Keeping up a lot of cardiol  Feeling well.  Past Medical History:  Diagnosis Date  . Adenomatous colon polyp 2011; 03/2015  . Basal cell carcinoma 04/2017   L scapula  . 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 Diverticulosis   . Extensor tenosynovitis of right wrist 06/2017   Dr. Tamala Julian  . Hyperlipidemia 2012   Diet/exercise x 79mo improved this.  . IFG (impaired fasting glucose) 02/15/2016   HbA1c 6.1%.  A1c 6.3% 03/2017--discussed possible start of metformin and pt to research and get back to me.  . Left shoulder pain 02/2016   ? calcific bursitis: Dr. Bing Neighbors NSAIDs.  Subacromial bursitis/impingement syndrome dx'd at f/u with Dr. Lavonna Rua injection given.  No improvement: MRI showed small insertional RC tear and adhesive capsulitis--steroid injection done again, PT and steroid injections helping as of 06/22/16.  . Low back pain     Past Surgical History:  Procedure Laterality Date  . BASAL CELL CARCINOMA EXCISION    . COLONOSCOPY W/ POLYPECTOMY  10/2009; 03/2015   2011 had 1 polyp, 2016 had 2 polyps: recall 5 yrs  . LUMBAR DISC SURGERY  03/2010   L4-5  . SHOULDER SURGERY Left 07/2016   Lysis of adhesions (calcific bursitis), bone spur removal, distal clavicle excision.    Outpatient Medications Prior to Visit  Medication Sig Dispense Refill  . atorvastatin (LIPITOR) 10 MG tablet TAKE 1 TABLET BY MOUTH EVERY DAY 90 tablet 1  . Misc Natural Products (TART CHERRY ADVANCED PO) Take by mouth.    . Turmeric 500 MG CAPS Take by mouth.    . Vitamin D, Ergocalciferol, (DRISDOL) 50000 units CAPS  capsule Take 1 capsule (50,000 Units total) by mouth every 7 (seven) days. (Patient not taking: Reported on 09/06/2017) 12 capsule 0   No facility-administered medications prior to visit.     No Known Allergies  ROS As per HPI  PE: Blood pressure (!) 106/59, pulse 97, temperature 97.7 F (36.5 C), temperature source Oral, resp. rate 16, height 6\' 3"  (1.905 m), weight 198 lb 6 oz (90 kg), SpO2 97 %. Gen: Alert, well appearing.  Patient is oriented to person, place, time, and situation. AFFECT: pleasant, lucid thought and speech. No further exam today.  LABS:  Lab Results  Component Value Date   TSH 2.71 02/15/2016   Lab Results  Component Value Date   WBC 4.6 03/08/2017   HGB 14.4 03/08/2017   HCT 43.7 03/08/2017   MCV 97.2 03/08/2017   PLT 213.0 03/08/2017   Lab Results  Component Value Date   CREATININE 0.95 03/08/2017   BUN 18 03/08/2017   NA 139 03/08/2017   K 4.4 03/08/2017   CL 103 03/08/2017   CO2 30 03/08/2017   Lab Results  Component Value Date   ALT 18 03/08/2017   AST 23 03/08/2017   ALKPHOS 57 03/08/2017   BILITOT 1.1 03/08/2017   Lab Results  Component Value Date   CHOL 146 03/08/2017   Lab Results  Component Value Date   HDL 55.30 03/08/2017   Lab  Results  Component Value Date   LDLCALC 80 03/08/2017   Lab Results  Component Value Date   TRIG 56.0 03/08/2017   Lab Results  Component Value Date   CHOLHDL 3 03/08/2017   Lab Results  Component Value Date   PSA 1.15 03/08/2017   PSA 0.9 02/15/2016   Lab Results  Component Value Date   HGBA1C 6.3 03/08/2017    IMPRESSION AND PLAN:  1) Prediabetes: doing well with TLC/wt loss. Recheck A1c today.  2) Hypercholesterolemia: tolerating atorvastatin 10mg  qd and lipid panel 6 mo excellent. Repeat 6 mo at CPE.  An After Visit Summary was printed and given to the patient.  FOLLOW UP: Return in about 6 months (around 03/08/2018) for annual CPE (fasting).  Signed:  Crissie Sickles, MD            09/06/2017

## 2017-09-07 LAB — HEPATITIS C ANTIBODY
Hepatitis C Ab: NONREACTIVE
SIGNAL TO CUT-OFF: 0.03 (ref <1.00)

## 2017-09-08 ENCOUNTER — Encounter: Payer: Self-pay | Admitting: *Deleted

## 2017-09-13 NOTE — Progress Notes (Signed)
[name] [address], [place] [address] Phone: [phone] Subjective:      CC: Right wrist pain follow-up  [id number]  Thomas Hubbard is a 57 y.o. male coming in with complaint of right wrist pain.  Patient was seen previously and did have more of an extensor tendinitis.  Patient was given an injection May 30.  Feeling 95% better.  Very minimal pain symptoms as a catching motion.  Did have a degenerative tear of the TFCC previously.  Patient is also complaining of right shoulder pain.  Seems to be anterior.  Not as severe as what his left shoulder previously was but is having some discomfort.  Rates the severity pain is 7 out of 10     Past Medical History:  Diagnosis Date  . Adenomatous colon polyp 2011; 03/2015  . Basal cell carcinoma 04/2017   L scapula  . 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 Diverticulosis   . Extensor tenosynovitis of right wrist 06/2017   Dr. Tamala Julian  . Hyperlipidemia 2012   Diet/exercise x 37mo improved this.  . IFG (impaired fasting glucose) 02/15/2016   HbA1c 6.1%.  A1c 6.3% 03/2017--discussed possible start of metformin and pt to research and get back to me.  . Left shoulder pain 02/2016   ? calcific bursitis: Dr. Bing Neighbors NSAIDs.  Subacromial bursitis/impingement syndrome dx'd at f/u with Dr. Lavonna Rua injection given.  No improvement: MRI showed small insertional RC tear and adhesive capsulitis--steroid injection done again, PT and steroid injections helping as of 06/22/16.  . Low back pain    Past Surgical History:  Procedure Laterality Date  . BASAL CELL CARCINOMA EXCISION    . COLONOSCOPY W/ POLYPECTOMY  10/2009; 03/2015   2011 had 1 polyp, 2016 had 2 polyps: recall 5 yrs  . LUMBAR DISC SURGERY  03/2010   L4-5  . SHOULDER SURGERY Left 07/2016   Lysis of adhesions (calcific bursitis), bone spur removal, distal clavicle excision.   Social History    Socioeconomic History  . Marital status: Married    Spouse name: Not on file  . Number of children: 3  . Years of education: Not on file  . Highest education level: Not on file  Occupational History  . Occupation: Banker: [employer]  . Financial resource strain: Not on file  . Food insecurity:    Worry: Not on file    Inability: Not on file  . Transportation needs:    Medical: Not on file    Non-medical: Not on file  Tobacco Use  . Smoking status: Never Smoker  . Smokeless tobacco: Never Used  Substance and Sexual Activity  . Alcohol use: Yes    Alcohol/week: 1.2 oz    Types: 2 Glasses of wine per week  . Drug use: No  . Sexual activity: Not on file  Lifestyle  . Physical activity:    Days per week: Not on file    Minutes per session: Not on file  . Stress: Not on file  Relationships  . Social connections:    Talks on phone: Not on file    Gets together: Not on file    Attends religious service: Not on file    Active member of club or organization: Not on file    Attends meetings of clubs or organizations: Not on file    Relationship status: Not on file  Other Topics Concern  . Not on file  Social History Narrative   Married, 3 grown children.   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      Competetive swimmer for many years, still swims as his primary form of exercise.      Originally from [place] area, big [employer] fan   No Known Allergies Family History  Problem Relation Age of Onset  . Cancer Mother 81       Colon  . Hyperlipidemia Father      Past medical history, social, surgical and family history all reviewed in electronic medical record.  No pertanent information unless stated regarding to the chief complaint.   Review of Systems:Review of systems updated and as accurate as of 09/14/17  No headache, visual changes, nausea, vomiting, diarrhea, constipation, dizziness, abdominal  pain, skin rash, fevers, chills, night sweats, weight loss, swollen lymph nodes, body aches, joint swelling,  chest pain, shortness of breath, mood changes.  Positive muscle aches  Objective  Systems examined below as of 09/14/17   General: No apparent distress alert and oriented x3 mood and affect normal, dressed appropriately.  HEENT: Pupils equal, extraocular movements intact  Respiratory: Patient's speak in full sentences and does not appear short of breath  Cardiovascular: No lower extremity edema, non tender, no erythema  Skin: Warm dry intact with no signs of infection or rash on extremities or on axial skeleton.  Abdomen: Soft nontender  Neuro: Cranial nerves II through XII are intact, neurovascularly intact in all extremities with 2+ DTRs and 2+ pulses.  Lymph: No lymphadenopathy of posterior or anterior cervical chain or axillae bilaterally.  Gait normal with good balance and coordination.  MSK:  Non tender with full range of motion and good stability and symmetric strength and tone of elbows,  hip, knee and ankles bilaterally.  Wrist: Right Inspection normal with no visible erythema or swelling. ROM smooth and normal with good flexion and extension and ulnar/radial deviation that is symmetrical with opposite wrist. Palpation is normal over metacarpals, navicular, lunate, and TFCC; tendons without tenderness/ swelling No snuffbox tenderness. No tenderness over Canal of Guyon. Strength 5/5 in all directions without pain. Negative Finkelstein, tinel's and phalens. Negative Watson's test. Contralateral wrist unremarkable   Shoulder: Right Inspection reveals no abnormalities, atrophy or asymmetry. Palpation is normal with no tenderness over AC joint or bicipital groove. ROM is full in all planes. Rotator cuff strength normal throughout. No signs of impingement with negative Neer and Hawkin's tests, empty can sign. Speeds and Yergason's tests are only positive. No labral  pathology noted with negative Obrien's, negative clunk and good stability. Normal scapular function observed. No painful arc and no drop arm sign. No apprehension sign Contralateral shoulder sounds fairly unremarkable.       Impression and Recommendations:     This case required medical decision making of moderate complexity.      Note: This dictation was prepared with Dragon dictation along with smaller phrase technology. Any transcriptional errors that result from this process are unintentional.

## 2017-09-14 ENCOUNTER — Ambulatory Visit: Payer: BLUE CROSS/BLUE SHIELD | Admitting: Family Medicine

## 2017-09-14 DIAGNOSIS — M65831 Other synovitis and tenosynovitis, right forearm: Secondary | ICD-10-CM | POA: Diagnosis not present

## 2017-09-14 DIAGNOSIS — S43003A Unspecified subluxation of unspecified shoulder joint, initial encounter: Secondary | ICD-10-CM

## 2017-09-14 DIAGNOSIS — S46119A Strain of muscle, fascia and tendon of long head of biceps, unspecified arm, initial encounter: Secondary | ICD-10-CM

## 2017-09-14 NOTE — Assessment & Plan Note (Signed)
Patient does have more of a subluxation of the tendon of the long head of the bicep.  Seems to be very mild overall.  Mild hypoechoic changes noted on ultrasound that was not saved today.  Topical anti-inflammatories, compression, icing regimen.  Follow-up again in 6 weeks if not completely resolved

## 2017-09-14 NOTE — Assessment & Plan Note (Signed)
Significant improvement after the injection.  We discussed icing regimen and home exercise.  Discussed which activities to do which wants to avoid.  As long as patient is well patient can follow-up as needed.

## 2017-09-14 NOTE — Patient Instructions (Signed)
Good to see you  Wrist looks great  Shoulder more bicep subluxation  Compression sleeve could help with lifting or a lot of yard work  Try to keep hands within peripheral vision  pennsaid pinkie amount topically 2 times daily as needed.   If not perfect in 6 weeks see me again

## 2017-10-18 ENCOUNTER — Encounter: Payer: Self-pay | Admitting: Family Medicine

## 2017-10-20 ENCOUNTER — Ambulatory Visit: Payer: BLUE CROSS/BLUE SHIELD | Admitting: Family Medicine

## 2018-02-26 ENCOUNTER — Other Ambulatory Visit: Payer: Self-pay | Admitting: Family Medicine

## 2018-03-13 ENCOUNTER — Ambulatory Visit (INDEPENDENT_AMBULATORY_CARE_PROVIDER_SITE_OTHER): Payer: BLUE CROSS/BLUE SHIELD | Admitting: Family Medicine

## 2018-03-13 ENCOUNTER — Encounter: Payer: Self-pay | Admitting: Family Medicine

## 2018-03-13 VITALS — BP 115/67 | HR 53 | Temp 98.3°F | Resp 16 | Ht 74.75 in | Wt 202.0 lb

## 2018-03-13 DIAGNOSIS — E7800 Pure hypercholesterolemia, unspecified: Secondary | ICD-10-CM | POA: Diagnosis not present

## 2018-03-13 DIAGNOSIS — Z23 Encounter for immunization: Secondary | ICD-10-CM | POA: Diagnosis not present

## 2018-03-13 DIAGNOSIS — Z0000 Encounter for general adult medical examination without abnormal findings: Secondary | ICD-10-CM | POA: Diagnosis not present

## 2018-03-13 DIAGNOSIS — R7303 Prediabetes: Secondary | ICD-10-CM | POA: Diagnosis not present

## 2018-03-13 DIAGNOSIS — Z125 Encounter for screening for malignant neoplasm of prostate: Secondary | ICD-10-CM | POA: Diagnosis not present

## 2018-03-13 LAB — LIPID PANEL
CHOL/HDL RATIO: 3
Cholesterol: 161 mg/dL (ref 0–200)
HDL: 53.3 mg/dL (ref >39.00)
LDL Cholesterol: 96 mg/dL (ref 0–99)
NonHDL: 107.34
TRIGLYCERIDES: 58 mg/dL (ref 0.0–149.0)
VLDL: 11.6 mg/dL (ref 0.0–40.0)

## 2018-03-13 LAB — CBC WITH DIFFERENTIAL/PLATELET
BASOS ABS: 0 10*3/uL (ref 0.0–0.1)
Basophils Relative: 0.7 % (ref 0.0–3.0)
Eosinophils Absolute: 0.1 10*3/uL (ref 0.0–0.7)
Eosinophils Relative: 1.5 % (ref 0.0–5.0)
HEMATOCRIT: 43.8 % (ref 39.0–52.0)
HEMOGLOBIN: 14.4 g/dL (ref 13.0–17.0)
LYMPHS ABS: 1.4 10*3/uL (ref 0.7–4.0)
LYMPHS PCT: 25.8 % (ref 12.0–46.0)
MCHC: 32.9 g/dL (ref 30.0–36.0)
MCV: 96.9 fl (ref 78.0–100.0)
MONOS PCT: 5.9 % (ref 3.0–12.0)
Monocytes Absolute: 0.3 10*3/uL (ref 0.1–1.0)
NEUTROS PCT: 66.1 % (ref 43.0–77.0)
Neutro Abs: 3.5 10*3/uL (ref 1.4–7.7)
Platelets: 204 10*3/uL (ref 150.0–400.0)
RBC: 4.52 Mil/uL (ref 4.22–5.81)
RDW: 13.8 % (ref 11.5–15.5)
WBC: 5.3 10*3/uL (ref 4.0–10.5)

## 2018-03-13 LAB — COMPREHENSIVE METABOLIC PANEL
ALT: 13 U/L (ref 0–53)
AST: 18 U/L (ref 0–37)
Albumin: 4.3 g/dL (ref 3.5–5.2)
Alkaline Phosphatase: 57 U/L (ref 39–117)
BUN: 19 mg/dL (ref 6–23)
CALCIUM: 9.3 mg/dL (ref 8.4–10.5)
CHLORIDE: 107 meq/L (ref 96–112)
CO2: 29 meq/L (ref 19–32)
CREATININE: 1.03 mg/dL (ref 0.40–1.50)
GFR: 78.6 mL/min (ref >60.00)
Glucose, Bld: 107 mg/dL — ABNORMAL HIGH (ref 70–99)
Potassium: 4.3 mEq/L (ref 3.5–5.1)
Sodium: 141 mEq/L (ref 135–145)
Total Bilirubin: 0.9 mg/dL (ref 0.2–1.2)
Total Protein: 7 g/dL (ref 6.0–8.3)

## 2018-03-13 LAB — HEMOGLOBIN A1C: Hgb A1c MFr Bld: 6.3 % (ref 4.6–6.5)

## 2018-03-13 LAB — PSA: PSA: 0.93 ng/mL (ref 0.10–4.00)

## 2018-03-13 NOTE — Progress Notes (Signed)
Office Note 03/13/2018  CC:  Chief Complaint  Patient presents with  . Annual Exam    Pt is fasting.    HPI:  Thomas Hubbard is a 57 y.o. White male who is here for annual health maintenance exam.  Doing well with TLC since dx of prediabetes.  Gained a little holiday wt but getting it back off some now.  Eyes and dental UTD.   Past Medical History:  Diagnosis Date  . Adenomatous colon polyp 2011; 03/2015  . Basal cell carcinoma 04/2017   L scapula  . 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 Diverticulosis   . Extensor tenosynovitis of right wrist 06/2017   Dr. Tamala Julian.  GREAT response to steroid injection.  . Hyperlipidemia 2012   Diet/exercise x 15mo improved this.  . IFG (impaired fasting glucose) 02/15/2016   HbA1c 6.1%.  A1c 6.3% 03/2017--discussed possible start of metformin and pt to research and get back to me.  . Left shoulder pain 02/2016   ? calcific bursitis: Dr. Bing Neighbors NSAIDs.  Subacromial bursitis/impingement syndrome dx'd at f/u with Dr. Lavonna Rua injection given.  No improvement: MRI showed small insertional RC tear and adhesive capsulitis--steroid injection done again, PT and steroid injections helping as of 06/22/16.  . Low back pain   . TFCC (triangular fibrocartilage complex) tear    degenerative, right    Past Surgical History:  Procedure Laterality Date  . BASAL CELL CARCINOMA EXCISION    . COLONOSCOPY W/ POLYPECTOMY  10/2009; 03/2015   2011 had 1 polyp, 2016 had 2 polyps: recall 5 yrs  . LUMBAR DISC SURGERY  03/2010   L4-5  . SHOULDER SURGERY Left 07/2016   Lysis of adhesions (calcific bursitis), bone spur removal, distal clavicle excision.    Family History  Problem Relation Age of Onset  . Cancer Mother 75       Colon  . Hyperlipidemia Father     Social History   Socioeconomic History  . Marital status: Married    Spouse name: Not on file  . Number of children: 3  . Years of education: Not  on file  . Highest education level: Not on file  Occupational History  . 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 Financial resource strain: Not on file  . Food insecurity:    Worry: Not on file    Inability: Not on file  . Transportation needs:    Medical: Not on file    Non-medical: Not on file  Tobacco Use  . Smoking status: Never Smoker  . Smokeless tobacco: Never Used  Substance and Sexual Activity  . Alcohol use: Yes    Alcohol/week: 2.0 standard drinks    Types: 2 Glasses of wine per week  . Drug use: No  . Sexual activity: Not on file  Lifestyle  . Physical activity:    Days per week: Not on file    Minutes per session: Not on file  . Stress: Not on file  Relationships  . Social connections:    Talks on phone: Not on file    Gets together: Not on file    Attends religious service: Not on file    Active member of club or organization: Not on file    Attends meetings of clubs or organizations: Not on file    Relationship status: Not on file  . Intimate partner violence:    Fear of current or  ex partner: Not on file    Emotionally abused: Not on file    Physically abused: Not on file    Forced sexual activity: Not on file  Other Topics Concern  . Not on file  Social History Narrative   Married, 3 grown children.   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      Competetive swimmer for many years, still swims as his primary form of exercise.      Originally from [place] area, big [employer]    Outpatient Medications Prior to Visit  Medication Sig Dispense Refill  . atorvastatin (LIPITOR) 10 MG tablet TAKE 1 TABLET BY MOUTH EVERY DAY 90 tablet 0  . Misc Natural Products (TART CHERRY ADVANCED PO) Take by mouth.    . Turmeric 500 MG CAPS Take by mouth.     No facility-administered medications prior to visit.     No Known Allergies  ROS Review of Systems  Constitutional: Negative for appetite  change, chills, fatigue and fever.  HENT: Negative for congestion, dental problem, ear pain and sore throat.   Eyes: Negative for discharge, redness and visual disturbance.  Respiratory: Negative for cough, chest tightness, shortness of breath and wheezing.   Cardiovascular: Positive for palpitations (very brief, occasional-->when very tired or after caffeine intake). Negative for chest pain.  Gastrointestinal: Negative for abdominal pain, blood in stool, diarrhea, nausea and vomiting.  Genitourinary: Negative for difficulty urinating, dysuria, flank pain, frequency, hematuria and urgency.  Musculoskeletal: Negative for arthralgias, back pain, joint swelling, myalgias and neck stiffness.  Skin: Negative for pallor and rash.  Neurological: Negative for dizziness, speech difficulty, weakness and headaches.  Hematological: Negative for adenopathy. Does not bruise/bleed easily.  Psychiatric/Behavioral: Negative for confusion and sleep disturbance. The patient is not nervous/anxious.     PE; Blood pressure 115/67, pulse (!) 53, temperature 98.3 F (36.8 C), temperature source Oral, resp. rate 16, height 6' 2.75" (1.899 m), weight 202 lb (91.6 kg), SpO2 98 %. Body mass index is 25.42 kg/m.  Gen: Alert, well appearing.  Patient is oriented to person, place, time, and situation. AFFECT: pleasant, lucid thought and speech. ENT: Ears: EACs clear, normal epithelium.  TMs with good light reflex and landmarks bilaterally.  Eyes: no injection, icteris, swelling, or exudate.  EOMI, PERRLA. Nose: no drainage or turbinate edema/swelling.  No injection or focal lesion.  Mouth: lips without lesion/swelling.  Oral mucosa pink and moist.  Dentition intact and without obvious caries or gingival swelling.  Oropharynx without erythema, exudate, or swelling.  Neck: supple/nontender.  No LAD, mass, or TM.  Carotid pulses 2+ bilaterally, without bruits. CV: RRR, no m/r/g.   LUNGS: CTA bilat, nonlabored resps, good  aeration in all lung fields. ABD: soft, NT, ND, BS normal.  No hepatospenomegaly or mass.  No bruits. EXT: no clubbing, cyanosis, or edema.  Musculoskeletal: no joint swelling, erythema, warmth, or tenderness.  ROM of all joints intact. Skin - no sores or suspicious lesions or rashes or color changes Rectal exam: negative without mass, lesions or tenderness, PROSTATE EXAM: smooth and symmetric without nodules or tenderness.   Pertinent labs:  Lab Results  Component Value Date   TSH 2.71 02/15/2016   Lab Results  Component Value Date   WBC 4.6 03/08/2017   HGB 14.4 03/08/2017   HCT 43.7 03/08/2017   MCV 97.2 03/08/2017   PLT 213.0 03/08/2017   Lab Results  Component Value Date   CREATININE 0.95 03/08/2017   BUN 18  03/08/2017   NA 139 03/08/2017   K 4.4 03/08/2017   CL 103 03/08/2017   CO2 30 03/08/2017   Lab Results  Component Value Date   ALT 18 03/08/2017   AST 23 03/08/2017   ALKPHOS 57 03/08/2017   BILITOT 1.1 03/08/2017   Lab Results  Component Value Date   CHOL 146 03/08/2017   Lab Results  Component Value Date   HDL 55.30 03/08/2017   Lab Results  Component Value Date   LDLCALC 80 03/08/2017   Lab Results  Component Value Date   TRIG 56.0 03/08/2017   Lab Results  Component Value Date   CHOLHDL 3 03/08/2017   Lab Results  Component Value Date   PSA 1.15 03/08/2017   PSA 0.9 02/15/2016   Lab Results  Component Value Date   HGBA1C 6.2 09/06/2017    ASSESSMENT AND PLAN:   Health maintenance exam: Reviewed age and gender appropriate health maintenance issues (prudent diet, regular exercise, health risks of tobacco and excessive alcohol, use of seatbelts, fire alarms in home, use of sunscreen).  Also reviewed age and gender appropriate health screening as well as vaccine recommendations. Vaccines: Flu vaccine--> given today.     Otherwise is UTD. Labs: CBC, CMET, FLP, HbA1c. Prostate ca screening: DRE normal today , PSA. Colon ca screening:  next colonoscopy due 2021.  An After Visit Summary was printed and given to the patient.  FOLLOW UP:  Return in about 6 months (around 09/12/2018) for routine chronic illness f/u.  Signed:  Crissie Sickles, MD           03/13/2018

## 2018-03-13 NOTE — Patient Instructions (Signed)

## 2018-03-14 ENCOUNTER — Encounter: Payer: Self-pay | Admitting: Family Medicine

## 2018-04-05 DIAGNOSIS — D225 Melanocytic nevi of trunk: Secondary | ICD-10-CM | POA: Diagnosis not present

## 2018-04-05 DIAGNOSIS — Z808 Family history of malignant neoplasm of other organs or systems: Secondary | ICD-10-CM | POA: Diagnosis not present

## 2018-04-05 DIAGNOSIS — L821 Other seborrheic keratosis: Secondary | ICD-10-CM | POA: Diagnosis not present

## 2018-04-05 DIAGNOSIS — L814 Other melanin hyperpigmentation: Secondary | ICD-10-CM | POA: Diagnosis not present

## 2018-06-19 ENCOUNTER — Other Ambulatory Visit: Payer: Self-pay | Admitting: Family Medicine

## 2018-06-19 MED ORDER — ATORVASTATIN CALCIUM 10 MG PO TABS: 10.0000 mg | ORAL_TABLET | Freq: Every day | ORAL | 2 refills | Status: DC

## 2018-06-19 NOTE — Telephone Encounter (Signed)
RX sent

## 2018-06-19 NOTE — Telephone Encounter (Signed)
Copied from [place] [phone]. Topic: Quick Communication - Rx Refill/Question >> Jun 19, 2018 11:35 AM Waylan Rocher, Lumin L wrote: Medication: atorvastatin (LIPITOR) 10 MG tablet  Has the patient contacted their pharmacy? Yes.   (Agent: If no, request that the patient contact the pharmacy for the refill.) (Agent: If yes, when and what did the pharmacy advise?)  Preferred Pharmacy (with phone number or street name): [id number] - [place], [place] - [address] [place] [address] Phone: [phone] Fax: [phone]  Agent: Please be advised that RX refills may take up to 3 business days. We ask that you follow-up with your pharmacy.

## 2018-08-15 NOTE — Progress Notes (Signed)
[facility] [place] [place], [place] [address] Phone: [phone] Subjective:    Fontaine No, am serving as a scribe for Dr. Hulan Saas.  CC: right shoulder pain  YOV:[id number]   09/15/2018: Patient does have more of a subluxation of the tendon of the long head of the bicep.  Seems to be very mild overall.  Mild hypoechoic changes noted on ultrasound that was not saved today.  Topical anti-inflammatories, compression, icing regimen.  Follow-up again in 6 weeks if not completely resolved  Update 08/16/2018: Thomas Hubbard is a 58 y.o. male coming in with complaint of right shoulder pain.  Seen nearly a year ago and was diagnosed with a biceps subluxation. Does swim 3-4x a week. Did try HEP for right shoulder. Is having pain and restricted ROM since stopping swimming. Pain over anterior shoulder into the bicep. Pennsaid helps with pain.      Past Medical History:  Diagnosis Date  . Adenomatous colon polyp 2011; 03/2015  . Basal cell carcinoma 04/2017   L scapula  . 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 Diverticulosis   . Extensor tenosynovitis of right wrist 06/2017   Dr. Tamala Julian.  GREAT response to steroid injection.  . Hyperlipidemia 2012   Diet/exercise x 21mo improved this.  . Left shoulder pain 02/2016   ? calcific bursitis: Dr. Bing Neighbors NSAIDs.  Subacromial bursitis/impingement syndrome dx'd at f/u with Dr. Lavonna Rua injection given.  No improvement: MRI showed small insertional RC tear and adhesive capsulitis--steroid injection done again, PT and steroid injections helping as of 06/22/16.  . Low back pain   . Prediabetes 02/15/2016   HbA1c 6.1%.  A1c 6.3% 03/2017.  TLC-->A1c hanging around 6.1-6.3 as of 03/2018 (on no med).  . TFCC (triangular fibrocartilage complex) tear    degenerative, right   Past Surgical History:  Procedure Laterality Date  . BASAL CELL CARCINOMA EXCISION    .  COLONOSCOPY W/ POLYPECTOMY  10/2009; 03/2015   2011 had 1 polyp, 2016 had 2 polyps: recall 5 yrs  . LUMBAR DISC SURGERY  03/2010   L4-5  . SHOULDER SURGERY Left 07/2016   Lysis of adhesions (calcific bursitis), bone spur removal, distal clavicle excision.   Social History   Socioeconomic History  . Marital status: Married    Spouse name: Not on file  . Number of children: 3  . Years of education: Not on file  . Highest education level: Not on file  Occupational History  . Occupation: Banker: [employer]  . Financial resource strain: Not on file  . Food insecurity:    Worry: Not on file    Inability: Not on file  . Transportation needs:    Medical: Not on file    Non-medical: Not on file  Tobacco Use  . Smoking status: Never Smoker  . Smokeless tobacco: Never Used  Substance and Sexual Activity  . Alcohol use: Yes    Alcohol/week: 2.0 standard drinks    Types: 2 Glasses of wine per week  . Drug use: No  . Sexual activity: Not on file  Lifestyle  . Physical activity:    Days per week: Not on file    Minutes per session: Not on file  . Stress: Not on file  Relationships  . Social connections:    Talks on phone: Not on file    Gets together: Not on  file    Attends religious service: Not on file    Active member of club or organization: Not on file    Attends meetings of clubs or organizations: Not on file    Relationship status: Not on file  Other Topics Concern  . Not on file  Social History Narrative   Married, 3 grown children.   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  In 2017 he switched to a [employer] called [employer] based out of [place], [place].   No T/A/Ds.      Competetive swimmer for many years, still swims as his primary form of exercise.      Originally from [place] area, big Eagles fan   No Known Allergies Family History  Problem Relation Age of Onset  . Cancer Mother 78        Colon  . Hyperlipidemia Father      Current Outpatient Medications (Cardiovascular):  .  atorvastatin (LIPITOR) 10 MG tablet, Take 1 tablet (10 mg total) by mouth daily.     Current Outpatient Medications (Other):  Marland Kitchen  Misc Natural Products (TART CHERRY ADVANCED PO), Take by mouth. .  Turmeric 500 MG CAPS, Take by mouth.    Past medical history, social, surgical and family history all reviewed in electronic medical record.  No pertanent information unless stated regarding to the chief complaint.   Review of Systems:  No headache, visual changes, nausea, vomiting, diarrhea, constipation, dizziness, abdominal pain, skin rash, fevers, chills, night sweats, weight loss, swollen lymph nodes, body aches, joint swelling,  chest pain, shortness of breath, mood changes.  Positive muscle aches  Objective  Blood pressure 122/82, pulse 65, height 6' 2.75" (1.899 m), weight 202 lb (91.6 kg), SpO2 98 %.     General: No apparent distress alert and oriented x3 mood and affect normal, dressed appropriately.  HEENT: Pupils equal, extraocular movements intact  Respiratory: Patient's speak in full sentences and does not appear short of breath  Cardiovascular: No lower extremity edema, non tender, no erythema  Skin: Warm dry intact with no signs of infection or rash on extremities or on axial skeleton.  Abdomen: Soft nontender  Neuro: Cranial nerves II through XII are intact, neurovascularly intact in all extremities with 2+ DTRs and 2+ pulses.  Lymph: No lymphadenopathy of posterior or anterior cervical chain or axillae bilaterally.  Gait normal with good balance and coordination.  MSK:  Non tender with full range of motion and good stability and symmetric strength and tone of  elbows, wrist, hip, knee and ankles bilaterally.   Right shoulder exam shows the patient does have decreased range of motion in all planes.  Positive crossover test.  Rotator cuff strength no 5 out of 5.  Patient does have  what appears to be even passive decreased range of motion in all planes of 5 to 10 degrees.  Procedure: Real-time Ultrasound Guided Injection of right acromioclavicular joint Device: GE Logiq Q7 Ultrasound guided injection is preferred based studies that show increased duration, increased effect, greater accuracy, decreased procedural pain, increased response rate, and decreased cost with ultrasound guided versus blind injection.  Verbal informed consent obtained.  Time-out conducted.  Noted no overlying erythema, induration, or other signs of local infection.  Skin prepped in a sterile fashion.  Local anesthesia: Topical Ethyl chloride.  With sterile technique and under real time ultrasound guidance: With a 25-gauge half inch needle patient was injected with 0.5 cc of 0.5% Marcaine and 0.5 cc of Kenalog 40  mg/mL Completed without difficulty  Pain immediately resolved suggesting accurate placement of the medication.  Advised to call if fevers/chills, erythema, induration, drainage, or persistent bleeding.  Images permanently stored and available for review in the ultrasound unit.  Impression: Technically successful ultrasound guided injection.    Impression and Recommendations:     This case required medical decision making of moderate complexity. The above documentation has been reviewed and is accurate and complete Lyndal Pulley, DO       Note: This dictation was prepared with Dragon dictation along with smaller phrase technology. Any transcriptional errors that result from this process are unintentional.

## 2018-08-16 ENCOUNTER — Encounter: Payer: Self-pay | Admitting: Family Medicine

## 2018-08-16 ENCOUNTER — Ambulatory Visit: Payer: Self-pay

## 2018-08-16 ENCOUNTER — Other Ambulatory Visit: Payer: Self-pay

## 2018-08-16 ENCOUNTER — Ambulatory Visit: Payer: BLUE CROSS/BLUE SHIELD | Admitting: Family Medicine

## 2018-08-16 VITALS — BP 122/82 | HR 65 | Ht 74.75 in | Wt 202.0 lb

## 2018-08-16 DIAGNOSIS — G8929 Other chronic pain: Secondary | ICD-10-CM

## 2018-08-16 DIAGNOSIS — M25511 Pain in right shoulder: Secondary | ICD-10-CM | POA: Diagnosis not present

## 2018-08-16 DIAGNOSIS — M19019 Primary osteoarthritis, unspecified shoulder: Secondary | ICD-10-CM

## 2018-08-16 MED ORDER — VITAMIN D (ERGOCALCIFEROL) 1.25 MG (50000 UNIT) PO CAPS: 50000.0000 [IU] | ORAL_CAPSULE | ORAL | 0 refills | Status: DC

## 2018-08-16 NOTE — Assessment & Plan Note (Signed)
Patient given injection and tolerated procedure well.  We discussed compression and icing regimen.  Discussed topical anti-inflammatories.  Discussed which activities to do which wants to avoid.  Differential includes frozen shoulder.  Discussed icing regimen and home exercises.  Patient will follow-up again in 2 weeks.  If no improvement will consider a intra-articular injection of the glenohumeral joint

## 2018-08-16 NOTE — Patient Instructions (Signed)
Good to see you  Ice  Once weekly vitamin D  Injected AC joint and the subacromial area.  I hope it helps Continue the range of motion  See me again in 6 weeks

## 2018-08-22 NOTE — Progress Notes (Signed)
[name] [address] [address], [place] [address] Phone: [phone] Subjective:   I Thomas Hubbard am serving as a Education administrator for Dr. Hulan Saas.   CC: R shoulder pain  LKG:[id number]    08/16/18: Patient given injection and tolerated procedure well.  We discussed compression and icing regimen.  Discussed topical anti-inflammatories.  Discussed which activities to do which wants to avoid.  Differential includes frozen shoulder.  Discussed icing regimen and home exercises.  Patient will follow-up again in 2 weeks.  If no improvement will consider a intra-articular injection of the glenohumeral joint  Thomas Hubbard is a 58 y.o. male coming in with complaint of R shoulder pain.  Pt states that he has improved R shoulder ROM since his last visit but still has restrictions.  He reports soreness in his R shoulder.  She states that he is no longer using Pennsaid as he is now able to sleep through the night.  He reports increased symptoms when attempting to elevate his arm in R shoulder aBd. Would like an injection.  Patient does feel about 50% better since doing the [facility] injection.  We have done the other subacromial injection previously but patient had a vasovagal response.     Past Medical History:  Diagnosis Date  . Adenomatous colon polyp 2011; 03/2015  . Basal cell carcinoma 04/2017   L scapula  . 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 Diverticulosis   . Extensor tenosynovitis of right wrist 06/2017   Dr. Tamala Julian.  GREAT response to steroid injection.  . Hyperlipidemia 2012   Diet/exercise x 27mo improved this.  . Left shoulder pain 02/2016   ? calcific bursitis: Dr. Bing Neighbors NSAIDs.  Subacromial bursitis/impingement syndrome dx'd at f/u with Dr. Lavonna Rua injection given.  No improvement: MRI showed small insertional RC tear and adhesive capsulitis--steroid injection done again, PT and steroid injections helping as of  06/22/16.  . Low back pain   . Prediabetes 02/15/2016   HbA1c 6.1%.  A1c 6.3% 03/2017.  TLC-->A1c hanging around 6.1-6.3 as of 03/2018 (on no med).  . TFCC (triangular fibrocartilage complex) tear    degenerative, right   Past Surgical History:  Procedure Laterality Date  . BASAL CELL CARCINOMA EXCISION    . COLONOSCOPY W/ POLYPECTOMY  10/2009; 03/2015   2011 had 1 polyp, 2016 had 2 polyps: recall 5 yrs  . LUMBAR DISC SURGERY  03/2010   L4-5  . SHOULDER SURGERY Left 07/2016   Lysis of adhesions (calcific bursitis), bone spur removal, distal clavicle excision.   Social History   Socioeconomic History  . Marital status: Married    Spouse name: Not on file  . Number of children: 3  . Years of education: Not on file  . Highest education level: Not on file  Occupational History  . 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 Financial resource strain: Not on file  . Food insecurity:    Worry: Not on file    Inability: Not on file  . Transportation needs:    Medical: Not on file    Non-medical: Not on file  Tobacco Use  . Smoking status: Never Smoker  . Smokeless tobacco: Never Used  Substance and Sexual Activity  . Alcohol use: Yes    Alcohol/week: 2.0 standard drinks    Types: 2 Glasses of wine per week  . Drug use: No  . Sexual activity:  Not on file  Lifestyle  . Physical activity:    Days per week: Not on file    Minutes per session: Not on file  . Stress: Not on file  Relationships  . Social connections:    Talks on phone: Not on file    Gets together: Not on file    Attends religious service: Not on file    Active member of club or organization: Not on file    Attends meetings of clubs or organizations: Not on file    Relationship status: Not on file  Other Topics Concern  . Not on file  Social History Narrative   Married, 3 grown children.   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   No T/A/Ds.      Competetive swimmer for many years, still swims as his primary form of exercise.      Originally from [place] area, big Eagles fan   No Known Allergies Family History  Problem Relation Age of Onset  . Cancer Mother 76       Colon  . Hyperlipidemia Father      Current Outpatient Medications (Cardiovascular):  .  atorvastatin (LIPITOR) 10 MG tablet, Take 1 tablet (10 mg total) by mouth daily.     Current Outpatient Medications (Other):  Marland Kitchen  Misc Natural Products (TART CHERRY ADVANCED PO), Take by mouth. .  Turmeric 500 MG CAPS, Take by mouth. .  Vitamin D, Ergocalciferol, (DRISDOL) 1.25 MG (50000 UT) CAPS capsule, Take 1 capsule (50,000 Units total) by mouth every 7 (seven) days.    Past medical history, social, surgical and family history all reviewed in electronic medical record.  No pertanent information unless stated regarding to the chief complaint.   Review of Systems:  No headache, visual changes, nausea, vomiting, diarrhea, constipation, dizziness, abdominal pain, skin rash, fevers, chills, night sweats, weight loss, swollen lymph nodes, body aches, joint swelling, muscle aches, chest pain, shortness of breath, mood changes.   Objective  Blood pressure 108/70, pulse (!) 54, height 6\' 2"  (1.88 m), weight 208 lb (94.3 kg), SpO2 98 %.    General: No apparent distress alert and oriented x3 mood and affect normal, dressed appropriately.  HEENT: Pupils equal, extraocular movements intact  Respiratory: Patient's speak in full sentences and does not appear short of breath  Cardiovascular: No lower extremity edema, non tender, no erythema  Skin: Warm dry intact with no signs of infection or rash on extremities or on axial skeleton.  Abdomen: Soft nontender  Neuro: Cranial nerves II through XII are intact, neurovascularly intact in all extremities with 2+ DTRs and 2+ pulses.  Lymph: No  lymphadenopathy of posterior or anterior cervical chain or axillae bilaterally.  Gait normal with good balance and coordination.  MSK:  Non tender with full range of motion and good stability and symmetric strength and tone of , elbows, wrist, hip, knee and ankles bilaterally.  Shoulder: Right Inspection reveals no abnormalities, atrophy or asymmetry. Palpation is normal with no tenderness over AC joint or bicipital groove. ROM is full in all planes passively. Rotator cuff strength normal throughout. signs of impingement with positive Neer and Hawkin's tests, but negative empty can sign. Speeds and Yergason's tests normal. No labral pathology noted with negative Obrien's, negative clunk and good stability. Normal scapular function observed. No painful arc and no drop arm sign. No apprehension sign  MSK US  performed of: Right This study was ordered, performed, and interpreted by Charlann Boxer D.O.  Shoulder:   Supraspinatus:  Appears normal on long and transverse views, Bursal bulge seen with shoulder abduction on impingement view. Infraspinatus:  Appears normal on long and transverse views. Significant increase in Doppler flow Subscapularis:  Appears normal on long and transverse views. Positive bursa Teres Minor:  Appears normal on long and transverse views. AC joint:  Capsule undistended, no geyser sign.  Improved from previous exam Glenohumeral Joint:  Appears normal without effusion. Glenoid Labrum:  Intact without visualized tears. Biceps Tendon:  Appears normal on long and transverse views, no fraying of tendon, tendon located in intertubercular groove, no subluxation with shoulder internal or external rotation.  Impression: Subacromial bursitis and acromioclavicular joint does appear improved  Procedure: Real-time Ultrasound Guided Injection of right glenohumeral joint Device: GE Logiq E  Ultrasound guided injection is preferred based studies that show increased duration,  increased effect, greater accuracy, decreased procedural pain, increased response rate with ultrasound guided versus blind injection.  Verbal informed consent obtained.  Time-out conducted.  Noted no overlying erythema, induration, or other signs of local infection.  Skin prepped in a sterile fashion.  Local anesthesia: Topical Ethyl chloride.  With sterile technique and under real time ultrasound guidance:  Joint visualized.  23g 1  inch needle inserted posterior approach. Pictures taken for needle placement. Patient did have injection of 2 cc of 1% lidocaine, 2 cc of 0.5% Marcaine, and 1.0 cc of Kenalog 40 mg/dL. Completed without difficulty  Pain immediately resolved suggesting accurate placement of the medication.  Advised to call if fevers/chills, erythema, induration, drainage, or persistent bleeding.  Images permanently stored and available for review in the ultrasound unit.  Impression: Technically successful ultrasound guided injection.    Impression and Recommendations:     This case required medical decision making of moderate complexity. The above documentation has been reviewed and is accurate and complete Lyndal Pulley, DO       Note: This dictation was prepared with Dragon dictation along with smaller phrase technology. Any transcriptional errors that result from this process are unintentional.

## 2018-08-23 ENCOUNTER — Ambulatory Visit: Payer: Self-pay

## 2018-08-23 ENCOUNTER — Other Ambulatory Visit: Payer: Self-pay

## 2018-08-23 ENCOUNTER — Encounter: Payer: Self-pay | Admitting: Family Medicine

## 2018-08-23 ENCOUNTER — Ambulatory Visit (INDEPENDENT_AMBULATORY_CARE_PROVIDER_SITE_OTHER): Payer: BLUE CROSS/BLUE SHIELD | Admitting: Family Medicine

## 2018-08-23 VITALS — BP 108/70 | HR 54 | Ht 74.0 in | Wt 208.0 lb

## 2018-08-23 DIAGNOSIS — G8929 Other chronic pain: Secondary | ICD-10-CM

## 2018-08-23 DIAGNOSIS — M7551 Bursitis of right shoulder: Secondary | ICD-10-CM | POA: Diagnosis not present

## 2018-08-23 DIAGNOSIS — M19019 Primary osteoarthritis, unspecified shoulder: Secondary | ICD-10-CM

## 2018-08-23 NOTE — Assessment & Plan Note (Signed)
Patient given injection.  Tolerated the procedure well.  Discussed icing regimen and home exercise.  Was found to have a subacromial bursitis.  I believe the patient will do well.  Discussed icing regimen.  Patient will follow-up with me again in 4 weeks.

## 2018-08-23 NOTE — Patient Instructions (Addendum)
Good to see you  Take 2  Ice is your friend Give it the weekend then start the exercises and lets see how you do.  I think you will do great but if not then we will consider MRI  See me again in 6 weeks

## 2018-08-24 ENCOUNTER — Encounter: Payer: Self-pay | Admitting: Family Medicine

## 2018-09-14 ENCOUNTER — Ambulatory Visit: Payer: BLUE CROSS/BLUE SHIELD | Admitting: Family Medicine

## 2018-09-26 ENCOUNTER — Other Ambulatory Visit: Payer: Self-pay

## 2018-09-26 ENCOUNTER — Encounter: Payer: Self-pay | Admitting: Family Medicine

## 2018-09-26 ENCOUNTER — Ambulatory Visit (INDEPENDENT_AMBULATORY_CARE_PROVIDER_SITE_OTHER): Payer: BC Managed Care – PPO | Admitting: Family Medicine

## 2018-09-26 VITALS — BP 122/75 | HR 50 | Temp 97.7°F | Resp 16 | Ht 74.0 in | Wt 199.6 lb

## 2018-09-26 DIAGNOSIS — E7800 Pure hypercholesterolemia, unspecified: Secondary | ICD-10-CM | POA: Diagnosis not present

## 2018-09-26 DIAGNOSIS — R7301 Impaired fasting glucose: Secondary | ICD-10-CM

## 2018-09-26 DIAGNOSIS — R7303 Prediabetes: Secondary | ICD-10-CM | POA: Diagnosis not present

## 2018-09-26 LAB — POCT GLYCOSYLATED HEMOGLOBIN (HGB A1C): Hemoglobin A1C: 6.1 % — AB (ref 4.0–5.6)

## 2018-09-26 NOTE — Progress Notes (Signed)
OFFICE VISIT  09/26/2018   CC:  Chief Complaint  Patient presents with  . Follow-up    RCI, pt is fasting    HPI:    Patient is a 58 y.o. Caucasian male who presents for 6 mo f/u prediabetes. HLD. Feeling well. Diet still good. Exercising regularly, running a lot. Taking statin daily w/out complaint.  Past Medical History:  Diagnosis Date  . Adenomatous colon polyp 2011; 03/2015  . Basal cell carcinoma 04/2017   L scapula  . Diverticulosis   . Extensor tenosynovitis of right wrist 06/2017   Dr. Tamala Julian.  GREAT response to steroid injection.  . Hyperlipidemia 2012   Diet/exercise x 77mo improved this.  . Left shoulder pain 02/2016   ? calcific bursitis: Dr. Bing Neighbors NSAIDs.  Subacromial bursitis/impingement syndrome dx'd at f/u with Dr. Lavonna Rua injection given.  No improvement: MRI showed small insertional RC tear and adhesive capsulitis--steroid injection done again, PT and steroid injections helping as of 06/22/16.  . Low back pain   . Prediabetes 02/15/2016   HbA1c 6.1%.  A1c 6.3% 03/2017.  TLC-->A1c hanging around 6.1-6.3 as of 03/2018 (on no med).  . Right shoulder pain    subacromial bursitis and AC joint arth: steroid inj's by Dr. Tamala Julian.  Marland Kitchen TFCC (triangular fibrocartilage complex) tear    degenerative, right    Past Surgical History:  Procedure Laterality Date  . BASAL CELL CARCINOMA EXCISION    . COLONOSCOPY W/ POLYPECTOMY  10/2009; 03/2015   2011 had 1 polyp, 2016 had 2 polyps: recall 5 yrs  . LUMBAR DISC SURGERY  03/2010   L4-5  . SHOULDER SURGERY Left 07/2016   Lysis of adhesions (calcific bursitis), bone spur removal, distal clavicle excision.    Outpatient Medications Prior to Visit  Medication Sig Dispense Refill  . atorvastatin (LIPITOR) 10 MG tablet Take 1 tablet (10 mg total) by mouth daily. 90 tablet 2  . Misc Natural Products (TART CHERRY ADVANCED PO) Take by mouth.    . Turmeric 500 MG CAPS Take by mouth.    . Vitamin D,  Ergocalciferol, (DRISDOL) 1.25 MG (50000 UT) CAPS capsule Take 1 capsule (50,000 Units total) by mouth every 7 (seven) days. 12 capsule 0   No facility-administered medications prior to visit.     Allergies  Allergen Reactions  . Poison Ivy Extract Itching and Rash    ROS As per HPI  PE: Blood pressure 122/75, pulse (!) 50, temperature 97.7 F (36.5 C), temperature source Temporal, resp. rate 16, height 6\' 2"  (1.88 m), weight 199 lb 9.6 oz (90.5 kg), SpO2 97 %. Gen: Alert, well appearing.  Patient is oriented to person, place, time, and situation. AFFECT: pleasant, lucid thought and speech. No further exam today.  LABS:    Chemistry      Component Value Date/Time   NA 141 03/13/2018 0828   NA 139 02/15/2016   K 4.3 03/13/2018 0828   CL 107 03/13/2018 0828   CO2 29 03/13/2018 0828   BUN 19 03/13/2018 0828   BUN 17 02/15/2016   CREATININE 1.03 03/13/2018 0828   GLU 106 02/15/2016      Component Value Date/Time   CALCIUM 9.3 03/13/2018 0828   ALKPHOS 57 03/13/2018 0828   AST 18 03/13/2018 0828   ALT 13 03/13/2018 0828   BILITOT 0.9 03/13/2018 0828     Lab Results  Component Value Date   HGBA1C 6.1 (A) 09/26/2018   Lab Results  Component Value Date   CHOL 161  03/13/2018   HDL 53.30 03/13/2018   LDLCALC 96 03/13/2018   TRIG 58.0 03/13/2018   CHOLHDL 3 03/13/2018   Lab Results  Component Value Date   WBC 5.3 03/13/2018   HGB 14.4 03/13/2018   HCT 43.8 03/13/2018   MCV 96.9 03/13/2018   PLT 204.0 03/13/2018    IMPRESSION AND PLAN:  1) Prediabetes: doing great with TLC. POC HbA1c today was 6.1%-->improved!   2) Hyperlipidemia: doing fine on statin and TLC.  LDL 96, HDK 53, hepatic panel normal 6 mo ago. Plan repeat 6 mo.  An After Visit Summary was printed and given to the patient.  FOLLOW UP: Return in about 6 months (around 03/28/2019) for annual CPE (fasting).  Signed:  Crissie Sickles, MD           09/26/2018

## 2018-10-02 ENCOUNTER — Other Ambulatory Visit: Payer: Self-pay

## 2018-10-02 ENCOUNTER — Encounter: Payer: Self-pay | Admitting: Family Medicine

## 2018-10-02 ENCOUNTER — Ambulatory Visit: Payer: BLUE CROSS/BLUE SHIELD | Admitting: Family Medicine

## 2018-10-02 VITALS — BP 106/72 | HR 58 | Ht 74.0 in | Wt 200.0 lb

## 2018-10-02 DIAGNOSIS — G8929 Other chronic pain: Secondary | ICD-10-CM | POA: Diagnosis not present

## 2018-10-02 DIAGNOSIS — M7501 Adhesive capsulitis of right shoulder: Secondary | ICD-10-CM

## 2018-10-02 DIAGNOSIS — M7500 Adhesive capsulitis of unspecified shoulder: Secondary | ICD-10-CM | POA: Insufficient documentation

## 2018-10-02 DIAGNOSIS — M25511 Pain in right shoulder: Secondary | ICD-10-CM

## 2018-10-02 NOTE — Progress Notes (Signed)
[name] [address], [place] [address] Phone: [phone] Subjective:   I, Kandace Blitz, am serving as a scribe for Dr. Hulan Saas.  I'm seeing this patient by the request  of:    CC: Shoulder pain follow-up  [id number]   08/23/2018: Patient given injection.  Tolerated the procedure well.  Discussed icing regimen and home exercise.  Was found to have a subacromial bursitis.  I believe the patient will do well.  Discussed icing regimen.  Patient will follow-up with me again in 4 weeks.  Update 10/02/2018: Thomas Hubbard is a 58 y.o. male coming in with complaint of right shoulder pain. States his ROM is getting better. Not 100% yet. Would like to know what he is allowed to do in order to continue positive progression.  Patient feels now having some decreasing range of motion.  States that that is the only thing that is more concerning.  Patient states that the pain now is about 75% better.  Has been trying to increase some activity.    Past Medical History:  Diagnosis Date  . Adenomatous colon polyp 2011; 03/2015  . Basal cell carcinoma 04/2017   L scapula  . Diverticulosis   . Extensor tenosynovitis of right wrist 06/2017   Dr. Tamala Julian.  GREAT response to steroid injection.  . Hyperlipidemia 2012   Diet/exercise x 36mo improved this.  . Left shoulder pain 02/2016   ? calcific bursitis: Dr. Bing Neighbors NSAIDs.  Subacromial bursitis/impingement syndrome dx'd at f/u with Dr. Lavonna Rua injection given.  No improvement: MRI showed small insertional RC tear and adhesive capsulitis--steroid injection done again, PT and steroid injections helping as of 06/22/16.  . Low back pain   . Prediabetes 02/15/2016   HbA1c 6.1%.  A1c 6.3% 03/2017.  TLC-->A1c hanging around 6.1-6.3 as of 03/2018 (on no med).  . Right shoulder pain    subacromial bursitis and AC joint arth: steroid inj's by Dr. Tamala Julian.  Marland Kitchen TFCC (triangular fibrocartilage complex) tear     degenerative, right   Past Surgical History:  Procedure Laterality Date  . BASAL CELL CARCINOMA EXCISION    . COLONOSCOPY W/ POLYPECTOMY  10/2009; 03/2015   2011 had 1 polyp, 2016 had 2 polyps: recall 5 yrs  . LUMBAR DISC SURGERY  03/2010   L4-5  . SHOULDER SURGERY Left 07/2016   Lysis of adhesions (calcific bursitis), bone spur removal, distal clavicle excision.   Social History   Socioeconomic History  . Marital status: Married    Spouse name: Not on file  . Number of children: 3  . Years of education: Not on file  . Highest education level: Not on file  Occupational History  . Occupation: Banker: [employer]  . Financial resource strain: Not on file  . Food insecurity    Worry: Not on file    Inability: Not on file  . Transportation needs    Medical: Not on file    Non-medical: Not on file  Tobacco Use  . Smoking status: Never Smoker  . Smokeless tobacco: Never Used  Substance and Sexual Activity  . Alcohol use: Yes    Alcohol/week: 2.0 standard drinks    Types: 2 Glasses of wine per week  . Drug use: No  . Sexual activity: Not on file  Lifestyle  . Physical activity    Days per week: Not on file    Minutes per session: Not on file  .  Stress: Not on file  Relationships  . Social Herbalist on phone: Not on file    Gets together: Not on file    Attends religious service: Not on file    Active member of club or organization: Not on file    Attends meetings of clubs or organizations: Not on file    Relationship status: Not on file  Other Topics Concern  . Not on file  Social History Narrative   Married, 3 grown children.   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  In 2017 he switched to a [employer] called [employer] based out of [place], [place].   No T/A/Ds.      Competetive swimmer for many years, still swims as his primary form of exercise.      Originally from [place]  area, big Eagles fan   Allergies  Allergen Reactions  . Poison Ivy Extract Itching and Rash   Family History  Problem Relation Age of Onset  . Cancer Mother 44       Colon  . Hyperlipidemia Father      Current Outpatient Medications (Cardiovascular):  .  atorvastatin (LIPITOR) 10 MG tablet, Take 1 tablet (10 mg total) by mouth daily.     Current Outpatient Medications (Other):  Marland Kitchen  Misc Natural Products (TART CHERRY ADVANCED PO), Take by mouth. .  Turmeric 500 MG CAPS, Take by mouth. .  Vitamin D, Ergocalciferol, (DRISDOL) 1.25 MG (50000 UT) CAPS capsule, Take 1 capsule (50,000 Units total) by mouth every 7 (seven) days.    Past medical history, social, surgical and family history all reviewed in electronic medical record.  No pertanent information unless stated regarding to the chief complaint.   Review of Systems:  No headache, visual changes, nausea, vomiting, diarrhea, constipation, dizziness, abdominal pain, skin rash, fevers, chills, night sweats, weight loss, swollen lymph nodes, body aches, joint swelling, muscle aches, chest pain, shortness of breath, mood changes.   Objective  Blood pressure 106/72, pulse (!) 58, height 6\' 2"  (1.88 m), weight 200 lb (90.7 kg), SpO2 96 %.    General: No apparent distress alert and oriented x3 mood and affect normal, dressed appropriately.  HEENT: Pupils equal, extraocular movements intact  Respiratory: Patient's speak in full sentences and does not appear short of breath  Cardiovascular: No lower extremity edema, non tender, no erythema  Skin: Warm dry intact with no signs of infection or rash on extremities or on axial skeleton.  Abdomen: Soft nontender  Neuro: Cranial nerves II through XII are intact, neurovascularly intact in all extremities with 2+ DTRs and 2+ pulses.  Lymph: No lymphadenopathy of posterior or anterior cervical chain or axillae bilaterally.  Gait normal with good balance and coordination.  MSK:  Non tender  with full range of motion and good stability and symmetric strength and tone of  elbows, wrist, hip, knee and ankles bilaterally.  Right shoulder exam shows the patient does have some limited range of motion which is new.  Forward flexion of 160 degrees, external rotation of 10 degrees, internal rotation to sacrum.  Contralateral shoulder unremarkable   Impression and Recommendations:     y. The above documentation has been reviewed and is accurate and complete Lyndal Pulley, DO       Note: This dictation was prepared with Dragon dictation along with smaller phrase technology. Any transcriptional errors that result from this process are unintentional.

## 2018-10-02 NOTE — Assessment & Plan Note (Signed)
Frozen frozen shoulder.  Discussed with patient in great length about icing regimen, home exercises, which activities to do which wants to avoid.  Patient is to increase activity slowly over the course of next several days.  Patient will start with formal physical therapy.  Follow-up with me again in 6 to 8 weeks

## 2018-10-02 NOTE — Patient Instructions (Addendum)
6 week follow up

## 2018-10-19 IMAGING — DX DG WRIST COMPLETE 3+V*R*
4 series · 4 of 4 positions shown · non-contrast
Comparison: No recent.

CLINICAL DATA: Right wrist pain.  No injury.

EXAM:
RIGHT WRIST - COMPLETE 3+ VIEW

[wrist pa]
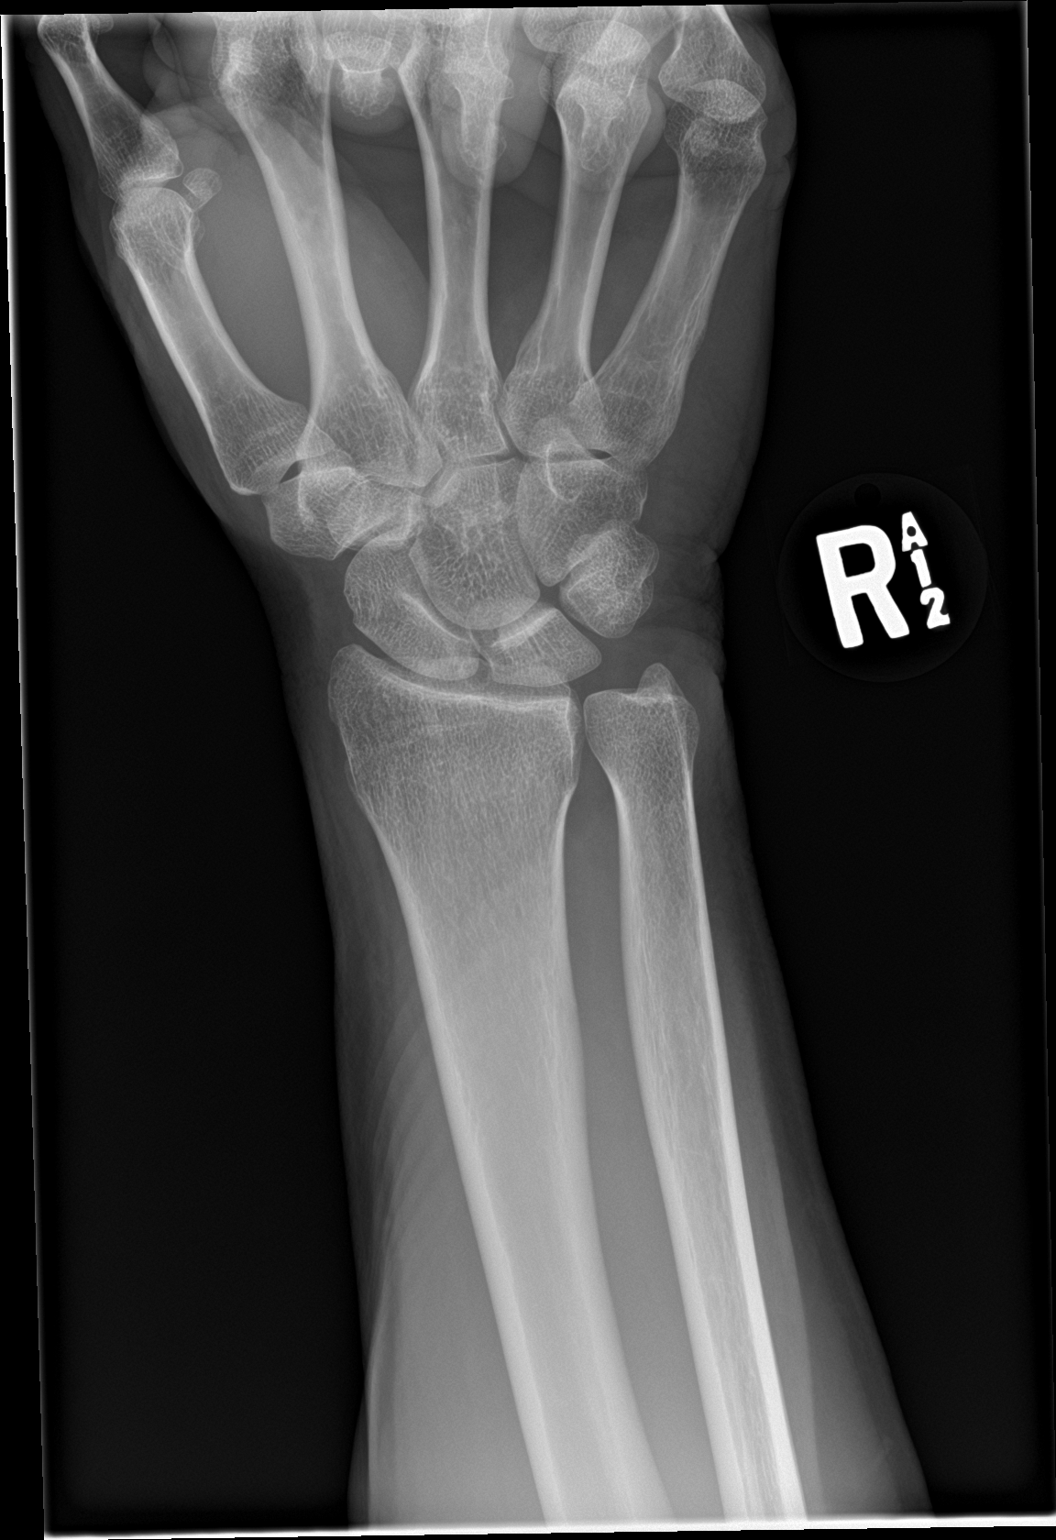

[wrist obl]
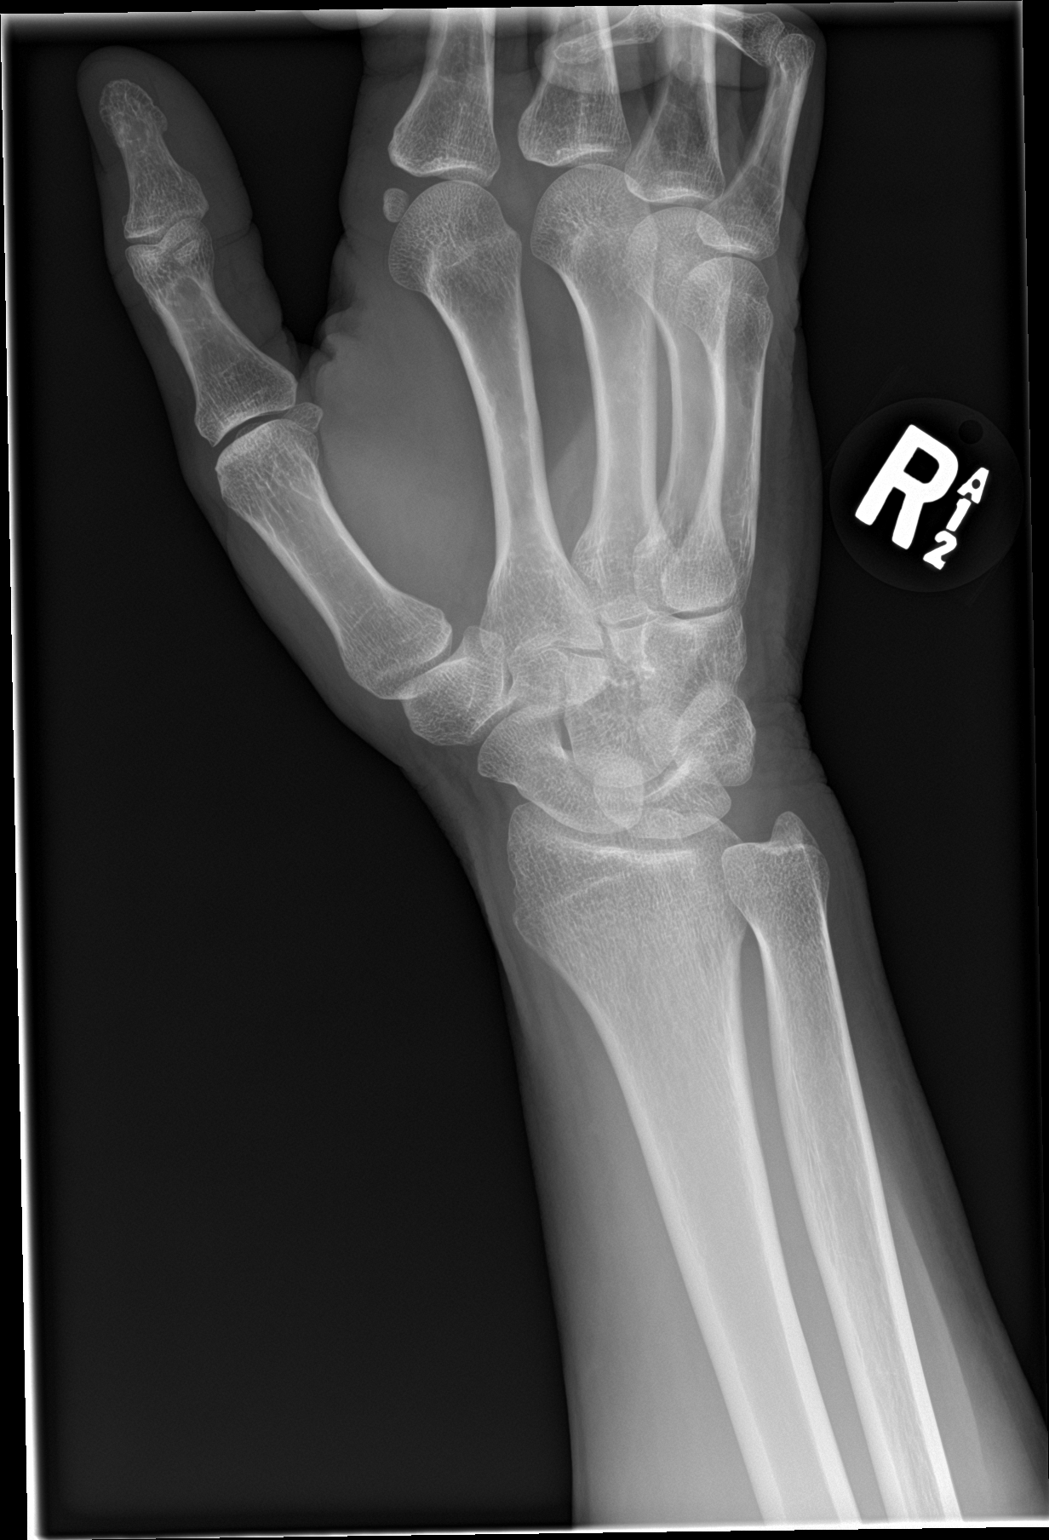

[wrist lat]
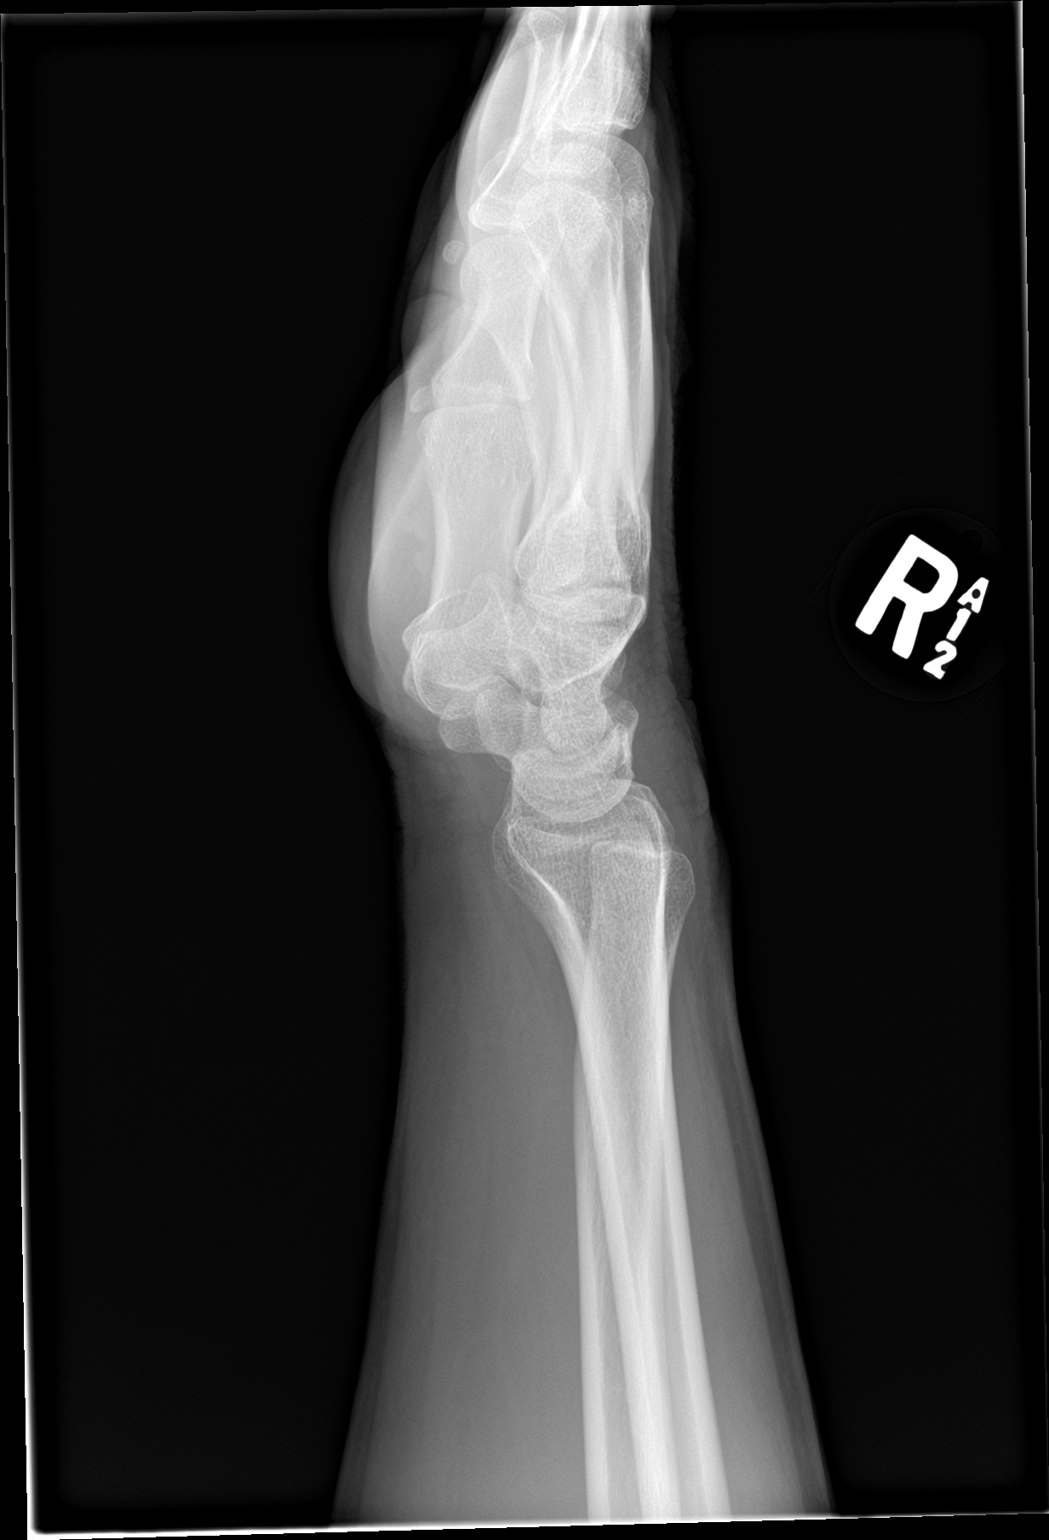

[navicular]
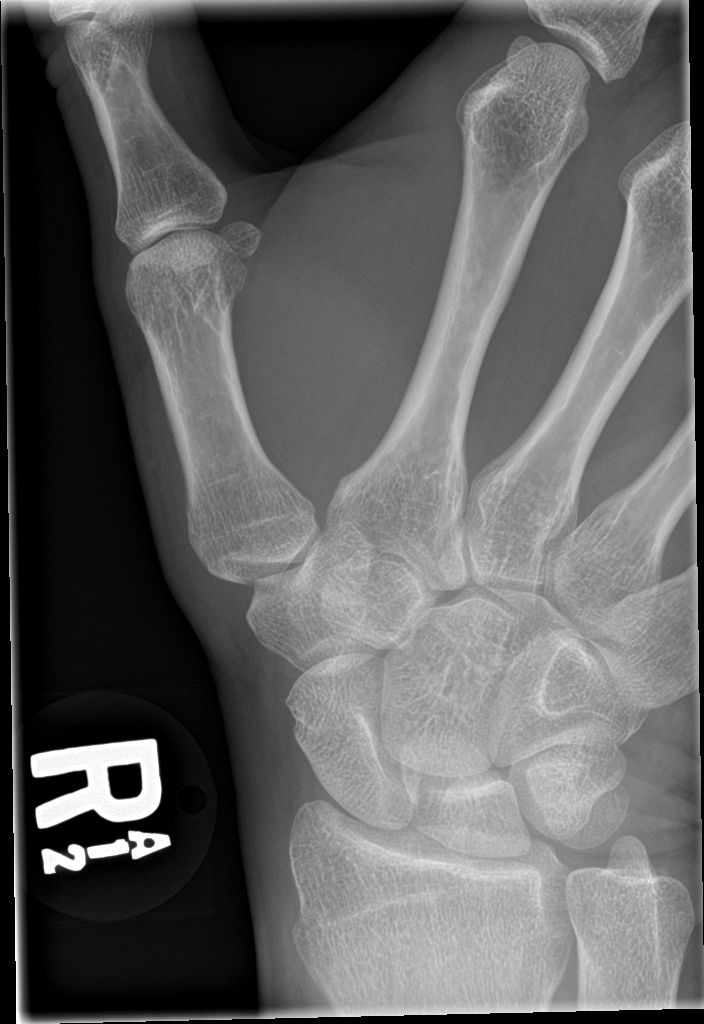

[4 of 4 positions shown; findings below may reference images not displayed]

FINDINGS: No acute bony or joint abnormality. No evidence of fracture or
dislocation.
IMPRESSION: No acute abnormality.

## 2018-11-05 ENCOUNTER — Encounter: Payer: Self-pay | Admitting: Family Medicine

## 2018-11-19 NOTE — Progress Notes (Signed)
[name] [address], [place] [address] Phone: [phone] Subjective:   I Kandace Blitz am serving as a Education administrator for Dr. Hulan Saas.   CC: Shoulder pain follow-up  [id number]   10/02/2018 Frozen frozen shoulder.  Discussed with patient in great length about icing regimen, home exercises, which activities to do which wants to avoid.  Patient is to increase activity slowly over the course of next several days.  Patient will start with formal physical therapy.  Follow-up with me again in 6 to 8 weeks  11/20/2018 Magdaleno Lortie is a 58 y.o. male coming in with complaint of right shoulder pain. Shoulder is feeling better. Has not heard from PT. Still having clicking with swimming. Full ROM is almost back.  Patient has been able to swim fairly regularly.  Patient is able to almost 25,000 m this morning.  Patient does stop early sometimes secondary to the discomfort.   Patient has had difficulty with left shoulder including subluxation of the bicep tendon, acromioclavicular joint arthropathy as well as bursitis.  Patient then developed a reactive and frozen shoulder.   Past Medical History:  Diagnosis Date  . Adenomatous colon polyp 2011; 03/2015  . Basal cell carcinoma 04/2017   L scapula  . Diverticulosis   . Extensor tenosynovitis of right wrist 06/2017   Dr. Tamala Julian.  GREAT response to steroid injection.  . Hyperlipidemia 2012   Diet/exercise x 21mo improved this.  . Left shoulder pain 02/2016   ? calcific bursitis: Dr. Bing Neighbors NSAIDs.  Subacromial bursitis/impingement syndrome dx'd at f/u with Dr. Lavonna Rua injection given.  No improvement: MRI showed small insertional RC tear and adhesive capsulitis--steroid injection done again, PT and steroid injections helping as of 06/22/16.  . Low back pain   . Prediabetes 02/15/2016   HbA1c 6.1%.  A1c 6.3% 03/2017.  TLC-->A1c hanging around 6.1-6.3 as of 03/2018 (on no med).  . Right shoulder  pain    subacromial bursitis and AC joint arth: steroid inj's by Dr. Tamala Julian.  Adhesive capsulitis 09/2018.  Marland Kitchen TFCC (triangular fibrocartilage complex) tear    degenerative, right   Past Surgical History:  Procedure Laterality Date  . BASAL CELL CARCINOMA EXCISION    . COLONOSCOPY W/ POLYPECTOMY  10/2009; 03/2015   2011 had 1 polyp, 2016 had 2 polyps: recall 5 yrs  . LUMBAR DISC SURGERY  03/2010   L4-5  . SHOULDER SURGERY Left 07/2016   Lysis of adhesions (calcific bursitis), bone spur removal, distal clavicle excision.   Social History   Socioeconomic History  . Marital status: Married    Spouse name: Not on file  . Number of children: 3  . Years of education: Not on file  . Highest education level: Not on file  Occupational History  . 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 Financial resource strain: Not on file  . Food insecurity    Worry: Not on file    Inability: Not on file  . Transportation needs    Medical: Not on file    Non-medical: Not on file  Tobacco Use  . Smoking status: Never Smoker  . Smokeless tobacco: Never Used  Substance and Sexual Activity  . Alcohol use: Yes    Alcohol/week: 2.0 standard drinks    Types: 2 Glasses of wine per week  . Drug use: No  . Sexual activity: Not on file  Lifestyle  . Physical activity    Days  per week: Not on file    Minutes per session: Not on file  . Stress: Not on file  Relationships  . Social Herbalist on phone: Not on file    Gets together: Not on file    Attends religious service: Not on file    Active member of club or organization: Not on file    Attends meetings of clubs or organizations: Not on file    Relationship status: Not on file  Other Topics Concern  . Not on file  Social History Narrative   Married, 3 grown children.   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   No T/A/Ds.      Competetive swimmer for many years, still swims as his primary form of exercise.      Originally from [place] area, big Eagles fan   Allergies  Allergen Reactions  . Poison Ivy Extract Itching and Rash   Family History  Problem Relation Age of Onset  . Cancer Mother 73       Colon  . Hyperlipidemia Father      Current Outpatient Medications (Cardiovascular):  .  atorvastatin (LIPITOR) 10 MG tablet, Take 1 tablet (10 mg total) by mouth daily.     Current Outpatient Medications (Other):  Marland Kitchen  Misc Natural Products (TART CHERRY ADVANCED PO), Take by mouth. .  Turmeric 500 MG CAPS, Take by mouth.    Past medical history, social, surgical and family history all reviewed in electronic medical record.  No pertanent information unless stated regarding to the chief complaint.   Review of Systems:  No headache, visual changes, nausea, vomiting, diarrhea, constipation, dizziness, abdominal pain, skin rash, fevers, chills, night sweats, weight loss, swollen lymph nodes, body aches, joint swelling,  chest pain, shortness of breath, mood changes.  Positive muscle aches  Objective  Blood pressure 110/78, pulse (!) 57, height 6\' 2"  (1.88 m), weight 201 lb (91.2 kg), SpO2 97 %.    General: No apparent distress alert and oriented x3 mood and affect normal, dressed appropriately.  HEENT: Pupils equal, extraocular movements intact  Respiratory: Patient's speak in full sentences and does not appear short of breath  Cardiovascular: No lower extremity edema, non tender, no erythema  Skin: Warm dry intact with no signs of infection or rash on extremities or on axial skeleton.  Abdomen: Soft nontender  Neuro: Cranial nerves II through XII are intact, neurovascularly intact in all extremities with 2+ DTRs and 2+ pulses.  Lymph: No lymphadenopathy of posterior or anterior cervical chain or axillae bilaterally.  Gait normal with good balance and  coordination.  MSK:  Non tender with full range of motion and good stability and symmetric strength and tone of  elbows, wrist, hip, knee and ankles bilaterally.  Shoulder: Right Inspection reveals no abnormalities, atrophy or asymmetry. Palpation is normal with no tenderness over AC joint or bicipital groove. ROM is full in all planes. Rotator cuff strength normal throughout. Positive impingement still noted Speeds and Yergason's tests normal. No labral pathology noted with negative Obrien's, negative clunk and good stability. Normal scapular function observed. No painful arc and no drop arm sign. No apprehension sign Contralateral shoulder unremarkable    Impression and Recommendations:      The above documentation has been reviewed and is accurate and complete Lyndal Pulley, DO       Note: This dictation  was prepared with Dragon dictation along with smaller phrase technology. Any transcriptional errors that result from this process are unintentional.

## 2018-11-20 ENCOUNTER — Other Ambulatory Visit: Payer: Self-pay

## 2018-11-20 ENCOUNTER — Ambulatory Visit: Payer: BC Managed Care – PPO | Admitting: Family Medicine

## 2018-11-20 ENCOUNTER — Encounter: Payer: Self-pay | Admitting: Family Medicine

## 2018-11-20 VITALS — BP 110/78 | HR 57 | Ht 74.0 in | Wt 201.0 lb

## 2018-11-20 DIAGNOSIS — M7501 Adhesive capsulitis of right shoulder: Secondary | ICD-10-CM

## 2018-11-20 DIAGNOSIS — M25511 Pain in right shoulder: Secondary | ICD-10-CM

## 2018-11-20 DIAGNOSIS — G8929 Other chronic pain: Secondary | ICD-10-CM

## 2018-11-20 NOTE — Assessment & Plan Note (Signed)
Patient has made significant progress at this time.  Was not having any significant pain in his left knee from activities.  Patient does still need to do formal physical therapy to try to improve anymore.  Referral placed in June to see if patient can start increasing range of motion and strength.  Patient will follow-up with me again in 2 months

## 2018-11-20 NOTE — Patient Instructions (Addendum)
Good to see you PT [place] [phone] Stay active  See me again in 8 weeks if not perfect

## 2019-01-13 NOTE — Progress Notes (Deleted)
[facility] [address], [place] [address] Phone: [phone] Subjective:    I'm seeing this patient by the request  of:    CC: Right shoulder pain follow-up  [id number]  Thomas Hubbard is a 58 y.o. male coming in with complaint of ***  Onset-  Location Duration-  Character- Aggravating factors- Reliving factors-  Therapies tried-  Severity-     Past Medical History:  Diagnosis Date  . Adenomatous colon polyp 2011; 03/2015  . Basal cell carcinoma 04/2017   L scapula  . Diverticulosis   . Extensor tenosynovitis of right wrist 06/2017   Dr. Tamala Julian.  GREAT response to steroid injection.  . Hyperlipidemia 2012   Diet/exercise x 49mo improved this.  . Left shoulder pain 02/2016   ? calcific bursitis: Dr. Bing Neighbors NSAIDs.  Subacromial bursitis/impingement syndrome dx'd at f/u with Dr. Lavonna Rua injection given.  No improvement: MRI showed small insertional RC tear and adhesive capsulitis--steroid injection done again, PT and steroid injections helping as of 06/22/16.  . Low back pain   . Prediabetes 02/15/2016   HbA1c 6.1%.  A1c 6.3% 03/2017.  TLC-->A1c hanging around 6.1-6.3 as of 03/2018 (on no med).  . Right shoulder pain    subacromial bursitis and AC joint arth: steroid inj's by Dr. Tamala Julian.  Adhesive capsulitis 09/2018.  Marland Kitchen TFCC (triangular fibrocartilage complex) tear    degenerative, right   Past Surgical History:  Procedure Laterality Date  . BASAL CELL CARCINOMA EXCISION    . COLONOSCOPY W/ POLYPECTOMY  10/2009; 03/2015   2011 had 1 polyp, 2016 had 2 polyps: recall 5 yrs  . LUMBAR DISC SURGERY  03/2010   L4-5  . SHOULDER SURGERY Left 07/2016   Lysis of adhesions (calcific bursitis), bone spur removal, distal clavicle excision.   Social History   Socioeconomic History  . Marital status: Married    Spouse name: Not on file  . Number of children: 3  . Years of education: Not on file  . Highest education level: Not  on file  Occupational History  . 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 Financial resource strain: Not on file  . Food insecurity    Worry: Not on file    Inability: Not on file  . Transportation needs    Medical: Not on file    Non-medical: Not on file  Tobacco Use  . Smoking status: Never Smoker  . Smokeless tobacco: Never Used  Substance and Sexual Activity  . Alcohol use: Yes    Alcohol/week: 2.0 standard drinks    Types: 2 Glasses of wine per week  . Drug use: No  . Sexual activity: Not on file  Lifestyle  . Physical activity    Days per week: Not on file    Minutes per session: Not on file  . Stress: Not on file  Relationships  . Social Herbalist on phone: Not on file    Gets together: Not on file    Attends religious service: Not on file    Active member of club or organization: Not on file    Attends meetings of clubs or organizations: Not on file    Relationship status: Not on file  Other Topics Concern  . Not on file  Social History Narrative   Married, 3 grown children.   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   No T/A/Ds.      Competetive swimmer for many years, still swims as his primary form of exercise.      Originally from [place] area, big Eagles fan   Allergies  Allergen Reactions  . Poison Ivy Extract Itching and Rash   Family History  Problem Relation Age of Onset  . Cancer Mother 33       Colon  . Hyperlipidemia Father      Current Outpatient Medications (Cardiovascular):  .  atorvastatin (LIPITOR) 10 MG tablet, Take 1 tablet (10 mg total) by mouth daily.     Current Outpatient Medications (Other):  Marland Kitchen  Misc Natural Products (TART CHERRY ADVANCED PO), Take by mouth. .  Turmeric 500 MG CAPS, Take by mouth.    Past medical history, social, surgical and family history all  reviewed in electronic medical record.  No pertanent information unless stated regarding to the chief complaint.   Review of Systems:  No headache, visual changes, nausea, vomiting, diarrhea, constipation, dizziness, abdominal pain, skin rash, fevers, chills, night sweats, weight loss, swollen lymph nodes, body aches, joint swelling, muscle aches, chest pain, shortness of breath, mood changes.   Objective  There were no vitals taken for this visit. Systems examined below as of    General: No apparent distress alert and oriented x3 mood and affect normal, dressed appropriately.  HEENT: Pupils equal, extraocular movements intact  Respiratory: Patient's speak in full sentences and does not appear short of breath  Cardiovascular: No lower extremity edema, non tender, no erythema  Skin: Warm dry intact with no signs of infection or rash on extremities or on axial skeleton.  Abdomen: Soft nontender  Neuro: Cranial nerves II through XII are intact, neurovascularly intact in all extremities with 2+ DTRs and 2+ pulses.  Lymph: No lymphadenopathy of posterior or anterior cervical chain or axillae bilaterally.  Gait normal with good balance and coordination.  MSK:  Non tender with full range of motion and good stability and symmetric strength and tone of elbows, wrist, hip, knee and ankles bilaterally.  Shoulder: Inspection reveals no abnormalities, atrophy or asymmetry. Palpation is normal with no tenderness over AC joint or bicipital groove. ROM is full in all planes. Rotator cuff strength normal throughout. No signs of impingement with negative Neer and Hawkin's tests, empty can sign. Speeds and Yergason's tests normal. No labral pathology noted with negative Obrien's, negative clunk and good stability. Normal scapular function observed. No painful arc and no drop arm sign. No apprehension sign Contralateral shoulder unremarkable    Impression and Recommendations:     This case required  medical decision making of moderate complexity. The above documentation has been reviewed and is accurate and complete Lyndal Pulley, DO       Note: This dictation was prepared with Dragon dictation along with smaller phrase technology. Any transcriptional errors that result from this process are unintentional.

## 2019-01-15 ENCOUNTER — Ambulatory Visit: Payer: BC Managed Care – PPO | Admitting: Family Medicine

## 2019-03-09 ENCOUNTER — Other Ambulatory Visit: Payer: Self-pay | Admitting: Family Medicine

## 2019-03-15 ENCOUNTER — Other Ambulatory Visit: Payer: Self-pay

## 2019-03-15 ENCOUNTER — Encounter: Payer: Self-pay | Admitting: Family Medicine

## 2019-03-15 ENCOUNTER — Ambulatory Visit (INDEPENDENT_AMBULATORY_CARE_PROVIDER_SITE_OTHER): Payer: BC Managed Care – PPO | Admitting: Family Medicine

## 2019-03-15 VITALS — BP 117/78 | HR 73 | Temp 98.3°F | Resp 16 | Ht 74.0 in | Wt 203.2 lb

## 2019-03-15 DIAGNOSIS — Z0000 Encounter for general adult medical examination without abnormal findings: Secondary | ICD-10-CM | POA: Diagnosis not present

## 2019-03-15 DIAGNOSIS — E663 Overweight: Secondary | ICD-10-CM

## 2019-03-15 DIAGNOSIS — R7303 Prediabetes: Secondary | ICD-10-CM

## 2019-03-15 DIAGNOSIS — E7800 Pure hypercholesterolemia, unspecified: Secondary | ICD-10-CM | POA: Diagnosis not present

## 2019-03-15 DIAGNOSIS — Z125 Encounter for screening for malignant neoplasm of prostate: Secondary | ICD-10-CM | POA: Diagnosis not present

## 2019-03-15 DIAGNOSIS — Z23 Encounter for immunization: Secondary | ICD-10-CM

## 2019-03-15 LAB — COMPREHENSIVE METABOLIC PANEL
ALT: 21 U/L (ref 0–53)
AST: 19 U/L (ref 0–37)
Albumin: 4.4 g/dL (ref 3.5–5.2)
Alkaline Phosphatase: 61 U/L (ref 39–117)
BUN: 23 mg/dL (ref 6–23)
CO2: 27 mEq/L (ref 19–32)
Calcium: 9.7 mg/dL (ref 8.4–10.5)
Chloride: 105 mEq/L (ref 96–112)
Creatinine, Ser: 1.16 mg/dL (ref 0.40–1.50)
GFR: 64.25 mL/min (ref >60.00)
Glucose, Bld: 98 mg/dL (ref 70–99)
Potassium: 4.7 mEq/L (ref 3.5–5.1)
Sodium: 140 mEq/L (ref 135–145)
Total Bilirubin: 1 mg/dL (ref 0.2–1.2)
Total Protein: 7.5 g/dL (ref 6.0–8.3)

## 2019-03-15 LAB — CBC WITH DIFFERENTIAL/PLATELET
Basophils Absolute: 0 10*3/uL (ref 0.0–0.1)
Basophils Relative: 0.4 % (ref 0.0–3.0)
Eosinophils Absolute: 0.1 10*3/uL (ref 0.0–0.7)
Eosinophils Relative: 1.3 % (ref 0.0–5.0)
HCT: 45.9 % (ref 39.0–52.0)
Hemoglobin: 15 g/dL (ref 13.0–17.0)
Lymphocytes Relative: 24.4 % (ref 12.0–46.0)
Lymphs Abs: 1.8 10*3/uL (ref 0.7–4.0)
MCHC: 32.8 g/dL (ref 30.0–36.0)
MCV: 97 fl (ref 78.0–100.0)
Monocytes Absolute: 0.5 10*3/uL (ref 0.1–1.0)
Monocytes Relative: 7 % (ref 3.0–12.0)
Neutro Abs: 4.9 10*3/uL (ref 1.4–7.7)
Neutrophils Relative %: 66.9 % (ref 43.0–77.0)
Platelets: 228 10*3/uL (ref 150.0–400.0)
RBC: 4.74 Mil/uL (ref 4.22–5.81)
RDW: 13.3 % (ref 11.5–15.5)
WBC: 7.4 10*3/uL (ref 4.0–10.5)

## 2019-03-15 LAB — LIPID PANEL
Cholesterol: 203 mg/dL — ABNORMAL HIGH (ref 0–200)
HDL: 57.1 mg/dL (ref >39.00)
LDL Cholesterol: 130 mg/dL — ABNORMAL HIGH (ref 0–99)
NonHDL: 146.11
Total CHOL/HDL Ratio: 4
Triglycerides: 81 mg/dL (ref 0.0–149.0)
VLDL: 16.2 mg/dL (ref 0.0–40.0)

## 2019-03-15 LAB — PSA: PSA: 1.13 ng/mL (ref 0.10–4.00)

## 2019-03-15 LAB — TSH: TSH: 1.65 u[IU]/mL (ref 0.35–4.50)

## 2019-03-15 LAB — HEMOGLOBIN A1C: Hgb A1c MFr Bld: 6.3 % (ref 4.6–6.5)

## 2019-03-15 NOTE — Progress Notes (Signed)
Office Note 03/15/2019  CC:  Chief Complaint  Patient presents with  . Annual Exam    pt is not fasting    HPI:  Thomas Hubbard is a 58 y.o. White male who is here for annual health maintenance exam. Doing great.  Takes great care of himself. Exercise: still swimming, running 2-3 d/week. Diet: healthy diet-"trying to".  Dental UTD. Vision UTD.  No acute complaint.  Past Medical History:  Diagnosis Date  . Adenomatous colon polyp 2011; 03/2015  . Basal cell carcinoma 04/2017   L scapula  . Diverticulosis   . Extensor tenosynovitis of right wrist 06/2017   Dr. Tamala Julian.  GREAT response to steroid injection.  . Hyperlipidemia 2012   Diet/exercise x 54mo improved this.  . Left shoulder pain 02/2016   ? calcific bursitis: Dr. Bing Neighbors NSAIDs.  Subacromial bursitis/impingement syndrome dx'd at f/u with Dr. Lavonna Rua injection given.  No improvement: MRI showed small insertional RC tear and adhesive capsulitis--steroid injection done again, PT and steroid injections helping as of 06/22/16.  . Low back pain   . Prediabetes 02/15/2016   HbA1c 6.1%.  A1c 6.3% 03/2017.  TLC-->A1c hanging around 6.1-6.3 as of 03/2018 (on no med).  . Right shoulder pain    subacromial bursitis and AC joint arth: steroid inj's by Dr. Tamala Julian.  Adhesive capsulitis 09/2018.  Marland Kitchen TFCC (triangular fibrocartilage complex) tear    degenerative, right    Past Surgical History:  Procedure Laterality Date  . BASAL CELL CARCINOMA EXCISION    . COLONOSCOPY W/ POLYPECTOMY  10/2009; 03/2015   2011 had 1 polyp, 2016 had 2 polyps: recall 5 yrs  . LUMBAR DISC SURGERY  03/2010   L4-5  . SHOULDER SURGERY Left 07/2016   Lysis of adhesions (calcific bursitis), bone spur removal, distal clavicle excision.    Family History  Problem Relation Age of Onset  . Cancer Mother 27       Colon  . Hyperlipidemia Father     Social History   Socioeconomic History  . Marital status: Married    Spouse name: Not on  file  . Number of children: 3  . Years of education: Not on file  . Highest education level: Not on file  Occupational History  . Occupation: Banker: [employer]  Tobacco Use  . Smoking status: Never Smoker  . Smokeless tobacco: Never Used  Substance and Sexual Activity  . Alcohol use: Yes    Alcohol/week: 2.0 standard drinks    Types: 2 Glasses of wine per week  . Drug use: No  . Sexual activity: Not on file  Other Topics Concern  . Not on file  Social History Narrative   Married, 3 grown children.   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  In 2017 he switched to a [employer] called [employer] based out of [place], [place].   No T/A/Ds.      Competetive swimmer for many years, still swims as his primary form of exercise.      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 Difficulty of Paying Living Expenses: Not on file  Food Insecurity:   . Worried About Charity fundraiser in the Last Year: Not on file  . Ran Out of Food in the Last Year: Not on file  Transportation Needs:   . Lack of Transportation (Medical): Not on file  . Lack of  Transportation (Non-Medical): Not on file  Physical Activity:   . Days of Exercise per Week: Not on file  . Minutes of Exercise per Session: Not on file  Stress:   . Feeling of Stress : Not on file  Social Connections:   . Frequency of Communication with Friends and Family: Not on file  . Frequency of Social Gatherings with Friends and Family: Not on file  . Attends Religious Services: Not on file  . Active Member of Clubs or Organizations: Not on file  . Attends Archivist Meetings: Not on file  . Marital Status: Not on file  Intimate Partner Violence:   . Fear of Current or Ex-Partner: Not on file  . Emotionally Abused: Not on file  . Physically Abused: Not on file  . Sexually Abused: Not on file     Outpatient Medications Prior to Visit  Medication Sig Dispense Refill  . atorvastatin (LIPITOR) 10 MG tablet TAKE 1 TABLET BY MOUTH EVERY DAY 90 tablet 2  . Misc Natural Products (TART CHERRY ADVANCED PO) Take by mouth.    . Multiple Vitamins-Minerals (MULTIVITAMIN ADULT PO) Take by mouth daily.    . Turmeric 500 MG CAPS Take by mouth.     No facility-administered medications prior to visit.    Allergies  Allergen Reactions  . Poison Ivy Extract Itching and Rash    ROS Review of Systems  Constitutional: Negative for appetite change, chills, fatigue and fever.  HENT: Negative for congestion, dental problem, ear pain and sore throat.   Eyes: Negative for discharge, redness and visual disturbance.  Respiratory: Negative for cough, chest tightness, shortness of breath and wheezing.   Cardiovascular: Negative for chest pain, palpitations and leg swelling.  Gastrointestinal: Negative for abdominal pain, blood in stool, diarrhea, nausea and vomiting.  Genitourinary: Negative for difficulty urinating, dysuria, flank pain, frequency, hematuria and urgency.  Musculoskeletal: Negative for arthralgias, back pain, joint swelling, myalgias and neck stiffness.  Skin: Negative for pallor and rash.  Neurological: Negative for dizziness, speech difficulty, weakness and headaches.  Hematological: Negative for adenopathy. Does not bruise/bleed easily.  Psychiatric/Behavioral: Negative for confusion and sleep disturbance. The patient is not nervous/anxious.     PE; Blood pressure 117/78, pulse 73, temperature 98.3 F (36.8 C), temperature source Temporal, resp. rate 16, height 6\' 2"  (1.88 m), weight 203 lb 3.2 oz (92.2 kg), SpO2 100 %. Body mass index is 26.09 kg/m.  Gen: Alert, well appearing.  Patient is oriented to person, place, time, and situation. AFFECT: pleasant, lucid thought and speech. ENT: Ears: EACs clear, normal epithelium.  TMs with good light reflex and landmarks bilaterally.   Eyes: no injection, icteris, swelling, or exudate.  EOMI, PERRLA. Nose: no drainage or turbinate edema/swelling.  No injection or focal lesion.  Mouth: lips without lesion/swelling.  Oral mucosa pink and moist.  Dentition intact and without obvious caries or gingival swelling.  Oropharynx without erythema, exudate, or swelling.  Neck: supple/nontender.  No LAD, mass, or TM.  Carotid pulses 2+ bilaterally, without bruits. CV: RRR, no m/r/g.   LUNGS: CTA bilat, nonlabored resps, good aeration in all lung fields. ABD: soft, NT, ND, BS normal.  No hepatospenomegaly or mass.  No bruits. EXT: no clubbing, cyanosis, or edema.  Musculoskeletal: no joint swelling, erythema, warmth, or tenderness.  ROM of all joints intact. Skin - no sores or suspicious lesions or rashes or color changes   Pertinent labs:  Lab Results  Component Value Date   TSH 2.71 02/15/2016  Lab Results  Component Value Date   WBC 5.3 03/13/2018   HGB 14.4 03/13/2018   HCT 43.8 03/13/2018   MCV 96.9 03/13/2018   PLT 204.0 03/13/2018   Lab Results  Component Value Date   CREATININE 1.03 03/13/2018   BUN 19 03/13/2018   NA 141 03/13/2018   K 4.3 03/13/2018   CL 107 03/13/2018   CO2 29 03/13/2018   Lab Results  Component Value Date   ALT 13 03/13/2018   AST 18 03/13/2018   ALKPHOS 57 03/13/2018   BILITOT 0.9 03/13/2018   Lab Results  Component Value Date   CHOL 161 03/13/2018   Lab Results  Component Value Date   HDL 53.30 03/13/2018   Lab Results  Component Value Date   LDLCALC 96 03/13/2018   Lab Results  Component Value Date   TRIG 58.0 03/13/2018   Lab Results  Component Value Date   CHOLHDL 3 03/13/2018   Lab Results  Component Value Date   PSA 0.93 03/13/2018   PSA 1.15 03/08/2017   PSA 0.9 02/15/2016   Lab Results  Component Value Date   HGBA1C 6.1 (A) 09/26/2018    ASSESSMENT AND PLAN:   Health maintenance exam: Reviewed age and gender appropriate health maintenance issues  (prudent diet, regular exercise, health risks of tobacco and excessive alcohol, use of seatbelts, fire alarms in home, use of sunscreen).  Also reviewed age and gender appropriate health screening as well as vaccine recommendations. Vaccines: Flu vaccine->given today.  Otherwise all UTD. Labs: fasting HP, HbA1c (prediabetes), PSA (he had an apple this morning). Prostate ca screening: DRE , PSA. Colon ca screening: next colonoscopy due 03/2020.  An After Visit Summary was printed and given to the patient.  FOLLOW UP:  No follow-ups on file.  Signed:  Crissie Sickles, MD           03/15/2019

## 2019-03-15 NOTE — Addendum Note (Signed)
Addended by: Deveron Furlong D on: 03/15/2019 09:00 AM   Modules accepted: Orders

## 2019-03-15 NOTE — Patient Instructions (Signed)

## 2019-03-19 ENCOUNTER — Other Ambulatory Visit: Payer: Self-pay

## 2019-03-19 DIAGNOSIS — E7800 Pure hypercholesterolemia, unspecified: Secondary | ICD-10-CM

## 2019-03-19 DIAGNOSIS — R7303 Prediabetes: Secondary | ICD-10-CM

## 2019-03-21 ENCOUNTER — Telehealth: Payer: Self-pay | Admitting: Family Medicine

## 2019-03-21 NOTE — Telephone Encounter (Signed)
Returning call from yesterday regarding his lab results  Patient can be reached at [phone]

## 2019-03-21 NOTE — Telephone Encounter (Signed)
Pt has been advised of results/recommendations. 

## 2019-03-21 NOTE — Telephone Encounter (Signed)
LM for pt to returncall

## 2019-04-08 DIAGNOSIS — D225 Melanocytic nevi of trunk: Secondary | ICD-10-CM | POA: Diagnosis not present

## 2019-04-08 DIAGNOSIS — D216 Benign neoplasm of connective and other soft tissue of trunk, unspecified: Secondary | ICD-10-CM | POA: Diagnosis not present

## 2019-04-08 DIAGNOSIS — Z808 Family history of malignant neoplasm of other organs or systems: Secondary | ICD-10-CM | POA: Diagnosis not present

## 2019-04-08 DIAGNOSIS — D485 Neoplasm of uncertain behavior of skin: Secondary | ICD-10-CM | POA: Diagnosis not present

## 2019-04-08 DIAGNOSIS — L570 Actinic keratosis: Secondary | ICD-10-CM | POA: Diagnosis not present

## 2019-04-08 DIAGNOSIS — L814 Other melanin hyperpigmentation: Secondary | ICD-10-CM | POA: Diagnosis not present

## 2019-04-08 DIAGNOSIS — L821 Other seborrheic keratosis: Secondary | ICD-10-CM | POA: Diagnosis not present

## 2019-05-13 ENCOUNTER — Ambulatory Visit: Payer: BC Managed Care – PPO | Attending: Internal Medicine

## 2019-05-13 DIAGNOSIS — Z23 Encounter for immunization: Secondary | ICD-10-CM | POA: Insufficient documentation

## 2019-05-13 NOTE — Progress Notes (Signed)
   Covid-19 Vaccination Clinic  Name:  Thomas Hubbard    MRN: [id number] DOB: March 12, 1961  05/13/2019  [name] was observed post Covid-19 immunization for 15 minutes without incidence. He was provided with Vaccine Information Sheet and instruction to access the V-Safe system.   [name] was instructed to call 911 with any severe reactions post vaccine: Marland Kitchen Difficulty breathing  . Swelling of your face and throat  . A fast heartbeat  . A bad rash all over your body  . Dizziness and weakness    Immunizations Administered    Name Date Dose VIS Date Route   Pfizer COVID-19 Vaccine 05/13/2019  6:19 PM 0.3 mL 03/15/2019 Intramuscular   Manufacturer: [place]   Lot: [id number]   [place]: [id number]

## 2019-05-16 ENCOUNTER — Telehealth: Payer: Self-pay

## 2019-05-16 MED ORDER — ATORVASTATIN CALCIUM 20 MG PO TABS: 20.0000 mg | ORAL_TABLET | Freq: Every day | ORAL | 0 refills | Status: DC

## 2019-05-16 NOTE — Telephone Encounter (Signed)
Patient called regarding one of his meds  atorvastatin (LIPITOR) 10 MG tablet   Pharmacy is saying "too early to fill"  However meds were increased to 20mg    He needs new prescription for Atorvasatin to be called into

## 2019-05-16 NOTE — Telephone Encounter (Signed)
Atorvastatin 20mg  sent into pharmacy, okay per last lab result note.

## 2019-06-07 ENCOUNTER — Ambulatory Visit: Payer: BC Managed Care – PPO | Attending: Internal Medicine

## 2019-06-07 DIAGNOSIS — Z23 Encounter for immunization: Secondary | ICD-10-CM | POA: Insufficient documentation

## 2019-06-07 NOTE — Progress Notes (Signed)
   Covid-19 Vaccination Clinic  Name:  Thomas Hubbard    MRN: [id number] DOB: 11-21-1960  06/07/2019  [name] was observed post Covid-19 immunization for 15 minutes without incident. He was provided with Vaccine Information Sheet and instruction to access the V-Safe system.   [name] was instructed to call 911 with any severe reactions post vaccine: Marland Kitchen Difficulty breathing  . Swelling of face and throat  . A fast heartbeat  . A bad rash all over body  . Dizziness and weakness   Immunizations Administered    Name Date Dose VIS Date Route   Pfizer COVID-19 Vaccine 06/07/2019  6:27 PM 0.3 mL 03/15/2019 Intramuscular   Manufacturer: [place]   Lot: [id number]   [place]: [id number]

## 2019-06-17 ENCOUNTER — Ambulatory Visit (INDEPENDENT_AMBULATORY_CARE_PROVIDER_SITE_OTHER): Payer: BC Managed Care – PPO | Admitting: Family Medicine

## 2019-06-17 ENCOUNTER — Other Ambulatory Visit: Payer: Self-pay

## 2019-06-17 DIAGNOSIS — E7800 Pure hypercholesterolemia, unspecified: Secondary | ICD-10-CM | POA: Diagnosis not present

## 2019-06-17 DIAGNOSIS — R7303 Prediabetes: Secondary | ICD-10-CM

## 2019-06-17 LAB — LIPID PANEL
Cholesterol: 151 mg/dL (ref 0–200)
HDL: 44.4 mg/dL (ref >39.00)
LDL Cholesterol: 92 mg/dL (ref 0–99)
NonHDL: 106.73
Total CHOL/HDL Ratio: 3
Triglycerides: 72 mg/dL (ref 0.0–149.0)
VLDL: 14.4 mg/dL (ref 0.0–40.0)

## 2019-06-17 LAB — HEMOGLOBIN A1C: Hgb A1c MFr Bld: 6.3 % (ref 4.6–6.5)

## 2019-06-18 ENCOUNTER — Other Ambulatory Visit: Payer: Self-pay | Admitting: Family Medicine

## 2019-06-18 ENCOUNTER — Encounter: Payer: Self-pay | Admitting: Family Medicine

## 2019-06-18 MED ORDER — ATORVASTATIN CALCIUM 20 MG PO TABS: 20.0000 mg | ORAL_TABLET | Freq: Every day | ORAL | 3 refills | Status: DC

## 2019-09-13 ENCOUNTER — Ambulatory Visit: Payer: BC Managed Care – PPO | Admitting: Family Medicine

## 2019-09-24 ENCOUNTER — Ambulatory Visit: Payer: BC Managed Care – PPO | Admitting: Family Medicine

## 2019-09-24 ENCOUNTER — Other Ambulatory Visit: Payer: Self-pay

## 2019-09-24 ENCOUNTER — Encounter: Payer: Self-pay | Admitting: Family Medicine

## 2019-09-24 VITALS — BP 110/70 | HR 57 | Temp 97.9°F | Ht 74.0 in | Wt 200.0 lb

## 2019-09-24 DIAGNOSIS — E7800 Pure hypercholesterolemia, unspecified: Secondary | ICD-10-CM | POA: Diagnosis not present

## 2019-09-24 DIAGNOSIS — R7303 Prediabetes: Secondary | ICD-10-CM | POA: Diagnosis not present

## 2019-09-24 LAB — POCT GLYCOSYLATED HEMOGLOBIN (HGB A1C)
HbA1c POC (<> result, manual entry): 5.8 % (ref 4.0–5.6)
HbA1c, POC (controlled diabetic range): 5.8 % (ref 0.0–7.0)
HbA1c, POC (prediabetic range): 5.8 % (ref 5.7–6.4)
Hemoglobin A1C: 5.8 % — AB (ref 4.0–5.6)

## 2019-09-24 NOTE — Progress Notes (Signed)
OFFICE VISIT  09/24/2019   CC:  Chief Complaint  Patient presents with  . chronic illness    6 month f/u      HPI:    Patient is a 59 y.o. Caucasian male who presents for 6 mo f/u HLD and prediabetes.  Feeling well.  Swimming and running. Diet is getting better, still struggling some with low carb aspect.  No side effects from atorvastatin.  Compliant with this med daily.   Past Medical History:  Diagnosis Date  . Adenomatous colon polyp 2011; 03/2015  . Basal cell carcinoma 04/2017   L scapula  . Diverticulosis   . Extensor tenosynovitis of right wrist 06/2017   Dr. Tamala Julian.  GREAT response to steroid injection.  . Hyperlipidemia 2012   TLC helpful initially, but eventually started atorva 03/2019 and had good response.  . Left shoulder pain 02/2016   ? calcific bursitis: Dr. Bing Neighbors NSAIDs.  Subacromial bursitis/impingement syndrome dx'd at f/u with Dr. Lavonna Rua injection given.  No improvement: MRI showed small insertional RC tear and adhesive capsulitis--steroid injection done again, PT and steroid injections helping as of 06/22/16.  . Low back pain   . Prediabetes 02/15/2016   HbA1c 6.1%.  A1c 6.3% 03/2017.  TLC-->A1c hanging around 6.1-6.3 as of 06/2019 (on no med).  . Right shoulder pain    subacromial bursitis and AC joint arth: steroid inj's by Dr. Tamala Julian.  Adhesive capsulitis 09/2018.  Marland Kitchen TFCC (triangular fibrocartilage complex) tear    degenerative, right    Past Surgical History:  Procedure Laterality Date  . BASAL CELL CARCINOMA EXCISION    . COLONOSCOPY W/ POLYPECTOMY  10/2009; 03/2015   2011 had 1 polyp, 2016 had 2 polyps: recall 5 yrs  . LUMBAR DISC SURGERY  03/2010   L4-5  . SHOULDER SURGERY Left 07/2016   Lysis of adhesions (calcific bursitis), bone spur removal, distal clavicle excision.    Outpatient Medications Prior to Visit  Medication Sig Dispense Refill  . atorvastatin (LIPITOR) 20 MG tablet Take 1 tablet (20 mg total) by mouth  daily. 90 tablet 3  . Misc Natural Products (TART CHERRY ADVANCED PO) Take by mouth.    . Multiple Vitamins-Minerals (MULTIVITAMIN ADULT PO) Take by mouth daily.    . Turmeric 500 MG CAPS Take by mouth.     No facility-administered medications prior to visit.    Allergies  Allergen Reactions  . Poison Ivy Extract Itching and Rash    ROS As per HPI  PE: Vitals with BMI 09/24/2019 03/15/2019 11/20/2018  Height 6\' 2"  6\' 2"  6\' 2"   Weight 200 lbs 203 lbs 3 oz 201 lbs  BMI 25.67 74.94 49.6  Systolic 759 163 846  Diastolic 70 78 78  Pulse 57 73 57    Gen: Alert, well appearing.  Patient is oriented to person, place, time, and situation. AFFECT: pleasant, lucid thought and speech. No further exam today.  LABS:  Lab Results  Component Value Date   TSH 1.65 03/15/2019   Lab Results  Component Value Date   WBC 7.4 03/15/2019   HGB 15.0 03/15/2019   HCT 45.9 03/15/2019   MCV 97.0 03/15/2019   PLT 228.0 03/15/2019   Lab Results  Component Value Date   CREATININE 1.16 03/15/2019   BUN 23 03/15/2019   NA 140 03/15/2019   K 4.7 03/15/2019   CL 105 03/15/2019   CO2 27 03/15/2019   Lab Results  Component Value Date   ALT 21 03/15/2019   AST  19 03/15/2019   ALKPHOS 61 03/15/2019   BILITOT 1.0 03/15/2019   Lab Results  Component Value Date   CHOL 151 06/17/2019   Lab Results  Component Value Date   HDL 44.40 06/17/2019   Lab Results  Component Value Date   LDLCALC 92 06/17/2019   Lab Results  Component Value Date   TRIG 72.0 06/17/2019   Lab Results  Component Value Date   CHOLHDL 3 06/17/2019   Lab Results  Component Value Date   PSA 1.13 03/15/2019   PSA 0.93 03/13/2018   PSA 1.15 03/08/2017   Lab Results  Component Value Date   HGBA1C 6.3 06/17/2019   POC HbA1c today->5.8%  IMPRESSION AND PLAN:  1) HLD: tolerating statin well, great lipid lowering effect from this. Plan repeat FLP 6 mo.  2) Prediabetes: great improvement in A1c today  (5.8%). Plan recheck 6 mo.  An After Visit Summary was printed and given to the patient.  FOLLOW UP: Return in about 6 months (around 03/25/2020) for annual CPE (fasting).  Signed:  Crissie Sickles, MD           09/24/2019

## 2020-02-03 DIAGNOSIS — I480 Paroxysmal atrial fibrillation: Secondary | ICD-10-CM

## 2020-02-03 HISTORY — DX: Paroxysmal atrial fibrillation: I48.0

## 2020-02-19 ENCOUNTER — Telehealth: Payer: Self-pay

## 2020-02-19 ENCOUNTER — Encounter: Payer: Self-pay | Admitting: Family Medicine

## 2020-02-19 ENCOUNTER — Ambulatory Visit: Payer: BC Managed Care – PPO | Admitting: Family Medicine

## 2020-02-19 ENCOUNTER — Other Ambulatory Visit: Payer: Self-pay

## 2020-02-19 VITALS — BP 92/59 | HR 77 | Temp 97.7°F | Resp 16 | Ht 74.0 in | Wt 208.8 lb

## 2020-02-19 DIAGNOSIS — I499 Cardiac arrhythmia, unspecified: Secondary | ICD-10-CM

## 2020-02-19 DIAGNOSIS — I4892 Unspecified atrial flutter: Secondary | ICD-10-CM | POA: Diagnosis not present

## 2020-02-19 DIAGNOSIS — I4891 Unspecified atrial fibrillation: Secondary | ICD-10-CM

## 2020-02-19 NOTE — Telephone Encounter (Signed)
Patient called to schedule an appt with Dr. Anitra Lauth for AFIB. I sent patient to triage, they called back to our office to schedule appt with Dr. Anitra Lauth. First available with Dr.McGowen is on December 9th. Can patient wait this long? Please advise.  Patient can be reached at [phone] after 1:00PM today.

## 2020-02-19 NOTE — Telephone Encounter (Signed)
Patient was contacted to see if able to come in for appt. Appt will be scheduled for 3:45 today

## 2020-02-19 NOTE — Telephone Encounter (Signed)
Spoke with patient and he gathered having "AFIB" based on apple watch alerts and ECG completed on watch yesterday and today. Denies having palpitations, flutters, chest pain or shortness of breath. He did feel sluggish and tired yesterday after swimming for exercise but did not do that today.

## 2020-02-19 NOTE — Telephone Encounter (Signed)
Need more details about reason he's calling->"AFIB". He has no such diagnosis, doesn't see heart MD, etc. Get details about what he is specifically feeling, etc, and let me know and I'll go from there.-thx

## 2020-02-19 NOTE — Progress Notes (Signed)
OFFICE VISIT  02/19/2020  CC:  Chief Complaint  Patient presents with  . Possible a-fib    gathered having "AFIB" based on apple watch alerts and ECG completed on watch yesterday and today. Denies having palpitations, flutters, chest pain or shortness of breath    HPI:    Patient is a 59 y.o. Caucasian male who presents for "a-fib". Last 2 d his apple watch has been alerting him regularly of "a-fib", HR anywhere from 40s to low 80s, usually around 60.  Has had this happen before, couple times per month or so over the last 1 yr.  He has had the apple watch for 3 yrs.  His wife found out about the readings today and this prompted him to seek care. Denies palpitations, SOB, dizziness, CP, or racing heart sensation.   Felt sluggish during his usual swimming workout yesterday and a little afterwards but no distinct sx's and this has all resolved.   He takes no new supplements, no recent dietary changes.  Occ does get the alert more often when he has been drinking more caffeine.     ROS:no fatigue, no fevers, no CP, no SOB, no wheezing, no cough,no HAs, no rashes, no melena/hematochezia.  No polyuria or polydipsia.  No myalgias or arthralgias.  No focal weakness, paresthesias, or tremors.  No acute vision or hearing abnormalities. No n/v/d or abd pain.  No palpitations.     Past Medical History:  Diagnosis Date  . Adenomatous colon polyp 2011; 03/2015  . Basal cell carcinoma 04/2017   L scapula  . Diverticulosis   . Extensor tenosynovitis of right wrist 06/2017   Dr. Tamala Julian.  GREAT response to steroid injection.  . Hyperlipidemia 2012   TLC helpful initially, but eventually started atorva 03/2019 and had good response.  . Left shoulder pain 02/2016   ? calcific bursitis: Dr. Bing Neighbors NSAIDs.  Subacromial bursitis/impingement syndrome dx'd at f/u with Dr. Lavonna Rua injection given.  No improvement: MRI showed small insertional RC tear and adhesive capsulitis--steroid injection  done again, PT and steroid injections helping as of 06/22/16.  . Low back pain   . Prediabetes 02/15/2016   HbA1c 6.1%.  A1c 6.3% 03/2017.  TLC-->A1c hanging around 6.1-6.3 as of 06/2019 (on no med).  . Right shoulder pain    subacromial bursitis and AC joint arth: steroid inj's by Dr. Tamala Julian.  Adhesive capsulitis 09/2018.  Marland Kitchen TFCC (triangular fibrocartilage complex) tear    degenerative, right    Past Surgical History:  Procedure Laterality Date  . BASAL CELL CARCINOMA EXCISION    . COLONOSCOPY W/ POLYPECTOMY  10/2009; 03/2015   2011 had 1 polyp, 2016 had 2 polyps: recall 5 yrs  . LUMBAR DISC SURGERY  03/2010   L4-5  . SHOULDER SURGERY Left 07/2016   Lysis of adhesions (calcific bursitis), bone spur removal, distal clavicle excision.    Outpatient Medications Prior to Visit  Medication Sig Dispense Refill  . atorvastatin (LIPITOR) 20 MG tablet Take 1 tablet (20 mg total) by mouth daily. 90 tablet 3  . Misc Natural Products (TART CHERRY ADVANCED PO) Take by mouth.    . Multiple Vitamins-Minerals (MULTIVITAMIN ADULT PO) Take by mouth daily.    . Turmeric 500 MG CAPS Take by mouth.     No facility-administered medications prior to visit.    Allergies  Allergen Reactions  . Poison Ivy Extract Itching and Rash    ROS As per HPI  PE: Vitals with BMI 02/19/2020 09/24/2019 03/15/2019  Height  6\' 2"  6\' 2"  6\' 2"   Weight 208 lbs 13 oz 200 lbs 203 lbs 3 oz  BMI 26.8 70.48 88.91  Systolic 92 694 503  Diastolic 59 70 78  Pulse 77 57 73   Gen: Alert, well appearing.  Patient is oriented to person, place, time, and situation. AFFECT: pleasant, lucid thought and speech. UUE:KCMK: no injection, icteris, swelling, or exudate.  EOMI, PERRLA. Mouth: lips without lesion/swelling.  Oral mucosa pink and moist. Oropharynx without erythema, exudate, or swelling.  Neck: no TM, nodule, or LAD. CV: irreg irreg, no m/r. Chest is clear, no wheezing or rales. Normal symmetric air entry throughout both  lung fields. No chest wall deformities or tenderness. EXT: no clubbing or cyanosis.  no edema.    LABS:  None today    Chemistry      Component Value Date/Time   NA 140 03/15/2019 0845   NA 139 02/15/2016 0000   K 4.7 03/15/2019 0845   CL 105 03/15/2019 0845   CO2 27 03/15/2019 0845   BUN 23 03/15/2019 0845   BUN 17 02/15/2016 0000   CREATININE 1.16 03/15/2019 0845   GLU 106 02/15/2016 0000      Component Value Date/Time   CALCIUM 9.7 03/15/2019 0845   ALKPHOS 61 03/15/2019 0845   AST 19 03/15/2019 0845   ALT 21 03/15/2019 0845   BILITOT 1.0 03/15/2019 0845     Lab Results  Component Value Date   TSH 1.65 03/15/2019    12 lead ekg today: a-fib/flutter, rate 63, QT interval normal, QRS duration normal.  Compared to  ekg 04/26/2010 : a fib/flutter is NEW.  IMPRESSION AND PLAN:  New dx a-fib/flutter. Suspect he has been in/out of this for the last 1 yr. Fortunately he is asymptomatic and vent response is normal. CHADSVASC score is ZERO. CBC, BMET, mag, TSH today. Echo ordered. Start ASA 81mg  qd today. Refer to a-fib clinic with [facility] cardiology.  An After Visit Summary was printed and given to the patient.  FOLLOW UP: Return for keep appt already set for CPE with me 03/16/20.  Signed:  Crissie Sickles, MD           02/19/2020

## 2020-02-19 NOTE — Telephone Encounter (Signed)
OK, pls work him in on my schedule anytime this afternoon, whatever works best for him---must be in-person.-thx

## 2020-02-19 NOTE — Telephone Encounter (Signed)
Please advise, thanks.

## 2020-02-20 ENCOUNTER — Encounter (HOSPITAL_COMMUNITY): Payer: Self-pay | Admitting: Nurse Practitioner

## 2020-02-20 ENCOUNTER — Ambulatory Visit (HOSPITAL_COMMUNITY)
Admission: RE | Admit: 2020-02-20 | Discharge: 2020-02-20 | Disposition: A | Discharge status: Home / Self Care | Payer: BC Managed Care – PPO | Source: Ambulatory Visit | Attending: Nurse Practitioner | Admitting: Nurse Practitioner

## 2020-02-20 VITALS — BP 120/68 | HR 58 | Ht 74.0 in | Wt 209.0 lb

## 2020-02-20 DIAGNOSIS — I4892 Unspecified atrial flutter: Secondary | ICD-10-CM | POA: Insufficient documentation

## 2020-02-20 DIAGNOSIS — I480 Paroxysmal atrial fibrillation: Secondary | ICD-10-CM | POA: Insufficient documentation

## 2020-02-20 DIAGNOSIS — Z79899 Other long term (current) drug therapy: Secondary | ICD-10-CM | POA: Diagnosis not present

## 2020-02-20 DIAGNOSIS — R001 Bradycardia, unspecified: Secondary | ICD-10-CM | POA: Insufficient documentation

## 2020-02-20 DIAGNOSIS — D6869 Other thrombophilia: Secondary | ICD-10-CM | POA: Diagnosis not present

## 2020-02-20 LAB — CBC WITH DIFFERENTIAL/PLATELET
Basophils Absolute: 0.1 10*3/uL (ref 0.0–0.1)
Basophils Relative: 1.3 % (ref 0.0–3.0)
Eosinophils Absolute: 0.2 10*3/uL (ref 0.0–0.7)
Eosinophils Relative: 2.2 % (ref 0.0–5.0)
HCT: 46.9 % (ref 39.0–52.0)
Hemoglobin: 15.4 g/dL (ref 13.0–17.0)
Lymphocytes Relative: 36.7 % (ref 12.0–46.0)
Lymphs Abs: 2.8 10*3/uL (ref 0.7–4.0)
MCHC: 32.8 g/dL (ref 30.0–36.0)
MCV: 96.5 fl (ref 78.0–100.0)
Monocytes Absolute: 0.6 10*3/uL (ref 0.1–1.0)
Monocytes Relative: 8.2 % (ref 3.0–12.0)
Neutro Abs: 4 10*3/uL (ref 1.4–7.7)
Neutrophils Relative %: 51.6 % (ref 43.0–77.0)
Platelets: 217 10*3/uL (ref 150.0–400.0)
RBC: 4.86 Mil/uL (ref 4.22–5.81)
RDW: 13.7 % (ref 11.5–15.5)
WBC: 7.8 10*3/uL (ref 4.0–10.5)

## 2020-02-20 LAB — BASIC METABOLIC PANEL
BUN: 28 mg/dL — ABNORMAL HIGH (ref 6–23)
CO2: 28 mEq/L (ref 19–32)
Calcium: 9.6 mg/dL (ref 8.4–10.5)
Chloride: 107 mEq/L (ref 96–112)
Creatinine, Ser: 1.38 mg/dL (ref 0.40–1.50)
GFR: 55.46 mL/min — ABNORMAL LOW (ref >60.00)
Glucose, Bld: 109 mg/dL — ABNORMAL HIGH (ref 70–99)
Potassium: 4.1 mEq/L (ref 3.5–5.1)
Sodium: 142 mEq/L (ref 135–145)

## 2020-02-20 LAB — TSH: TSH: 1.59 u[IU]/mL (ref 0.35–4.50)

## 2020-02-20 LAB — MAGNESIUM: Magnesium: 1.9 mg/dL (ref 1.5–2.5)

## 2020-02-20 MED ORDER — DILTIAZEM HCL 30 MG PO TABS: ORAL_TABLET | ORAL | 1 refills | Status: DC

## 2020-02-20 NOTE — Patient Instructions (Signed)
Cardizem 30mg  -- take 1/2-1 tablet every 4 hours AS NEEDED for heart rate >100 as long as blood pressure >100.

## 2020-02-20 NOTE — Progress Notes (Signed)
Primary Care Physician: Thomas Sou, MD Referring Physician: Dr. Siri Hubbard is a 59 y.o. male with a h/o palpitations for a year with apple watch reporting afib but it  did not last but a couple of hours, rates were slow  and would spontaneously resolve. He did not feel bad with these episodes. He did not seek medical care for this. He did see his  PCP yesterday as he had noted afib for 48 hours. Atrial flutter was documented in the PCP office with a v rate in the low 60's. He was sent here for evaluation and is in sinus rhythm at 59 bpm.  He reports that he drinks 1-2 alcoholic drinks several times a week. Moderate caffeine use. No sleep apnea/snoring reported. No tobacco. Avid exerciser, usually alternating swimming with running.   Today, he denies symptoms of palpitations, chest pain, shortness of breath, orthopnea, PND, lower extremity edema, dizziness, presyncope, syncope, or neurologic sequela. The patient is tolerating medications without difficulties and is otherwise without complaint today.   Past Medical History:  Diagnosis Date  . Adenomatous colon polyp 2011; 03/2015  . Basal cell carcinoma 04/2017   L scapula  . Diverticulosis   . Extensor tenosynovitis of right wrist 06/2017   Dr. Tamala Julian.  GREAT response to steroid injection.  . Hyperlipidemia 2012   TLC helpful initially, but eventually started atorva 03/2019 and had good response.  . Left shoulder pain 02/2016   ? calcific bursitis: Dr. Bing Neighbors NSAIDs.  Subacromial bursitis/impingement syndrome dx'd at f/u with Dr. Lavonna Rua injection given.  No improvement: MRI showed small insertional RC tear and adhesive capsulitis--steroid injection done again, PT and steroid injections helping as of 06/22/16.  . Low back pain   . Prediabetes 02/15/2016   HbA1c 6.1%.  A1c 6.3% 03/2017.  TLC-->A1c hanging around 6.1-6.3 as of 06/2019 (on no med).  . Right shoulder pain    subacromial bursitis and AC joint  arth: steroid inj's by Dr. Tamala Julian.  Adhesive capsulitis 09/2018.  Marland Kitchen TFCC (triangular fibrocartilage complex) tear    degenerative, right   Past Surgical History:  Procedure Laterality Date  . BASAL CELL CARCINOMA EXCISION    . COLONOSCOPY W/ POLYPECTOMY  10/2009; 03/2015   2011 had 1 polyp, 2016 had 2 polyps: recall 5 yrs  . LUMBAR DISC SURGERY  03/2010   L4-5  . SHOULDER SURGERY Left 07/2016   Lysis of adhesions (calcific bursitis), bone spur removal, distal clavicle excision.    Current Outpatient Medications  Medication Sig Dispense Refill  . Misc Natural Products (TART CHERRY ADVANCED PO) Take by mouth daily.     . Multiple Vitamins-Minerals (MULTIVITAMIN ADULT PO) Take by mouth daily.    . Turmeric 500 MG CAPS Take by mouth daily.     Marland Kitchen atorvastatin (LIPITOR) 20 MG tablet Take 1 tablet (20 mg total) by mouth daily. 90 tablet 3  . diltiazem (CARDIZEM) 30 MG tablet Take 1/2-1 tablet every 4 hours AS NEEDED for heart rate >100 as long as blood pressure >100. 45 tablet 1   No current facility-administered medications for this encounter.    Allergies  Allergen Reactions  . Poison Ivy Extract Itching and Rash    Social History   Socioeconomic History  . Marital status: Married    Spouse name: Not on file  . Number of children: 3  . Years of education: Not on file  . Highest education level: Not on file  Occupational History  . Occupation:  Regional Supply Manager    Employer: [employer]  Tobacco Use  . Smoking status: Never Smoker  . Smokeless tobacco: Never Used  Vaping Use  . Vaping Use: Never used  Substance and Sexual Activity  . Alcohol use: Yes    Alcohol/week: 2.0 standard drinks    Types: 2 Glasses of wine per week  . Drug use: No  . Sexual activity: Not on file  Other Topics Concern  . Not on file  Social History Narrative   Married, 3 grown children.   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  In 2017 he switched to a [employer] called  [employer] based out of [place], [place].   No T/A/Ds.      Competetive swimmer for many years, still swims as his primary form of exercise.      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 Difficulty of Paying Living Expenses: Not on file  Food Insecurity:   . Worried About Charity fundraiser in the Last Year: Not on file  . Ran Out of Food in the Last Year: Not on file  Transportation Needs:   . Lack of Transportation (Medical): Not on file  . Lack of Transportation (Non-Medical): Not on file  Physical Activity:   . Days of Exercise per Week: Not on file  . Minutes of Exercise per Session: Not on file  Stress:   . Feeling of Stress : Not on file  Social Connections:   . Frequency of Communication with Friends and Family: Not on file  . Frequency of Social Gatherings with Friends and Family: Not on file  . Attends Religious Services: Not on file  . Active Member of Clubs or Organizations: Not on file  . Attends Archivist Meetings: Not on file  . Marital Status: Not on file  Intimate Partner Violence:   . Fear of Current or Ex-Partner: Not on file  . Emotionally Abused: Not on file  . Physically Abused: Not on file  . Sexually Abused: Not on file    Family History  Problem Relation Age of Onset  . Cancer Mother 59       Colon  . Hyperlipidemia Father     ROS- All systems are reviewed and negative except as per the HPI above  Physical Exam: Vitals:   02/20/20 1453  BP: 120/68  Pulse: (!) 58  Weight: 94.8 kg  Height: 6\' 2"  (1.88 m)   Wt Readings from Last 3 Encounters:  02/20/20 94.8 kg  02/19/20 94.7 kg  09/24/19 90.7 kg    Labs: Lab Results  Component Value Date   NA 140 03/15/2019   K 4.7 03/15/2019   CL 105 03/15/2019   CO2 27 03/15/2019   GLUCOSE 98 03/15/2019   BUN 23 03/15/2019   CREATININE 1.16 03/15/2019   CALCIUM 9.7 03/15/2019   No results found for:  INR Lab Results  Component Value Date   CHOL 151 06/17/2019   HDL 44.40 06/17/2019   LDLCALC 92 06/17/2019   TRIG 72.0 06/17/2019     GEN- The patient is well appearing, alert and oriented x 3 today.   Head- normocephalic, atraumatic Eyes-  Sclera clear, conjunctiva pink Ears- hearing intact Oropharynx- clear Neck- supple, no JVP Lymph- no cervical lymphadenopathy Lungs- Clear to ausculation bilaterally, normal work of breathing Heart- Regular rate and rhythm, no murmurs, rubs or gallops, PMI not laterally displaced  GI- soft, NT, ND, + BS Extremities- no clubbing, cyanosis, or edema MS- no significant deformity or atrophy Skin- no rash or lesion Psych- euthymic mood, full affect Neuro- strength and sensation are intact  EKG-sinus brady at 59 bpm, pr int  190 ms, qrs int 88 ms, qtc 384 ms  EKG from  PCP office reviewed(in Epic) Apple  watch strips reviewed   Assessment and Plan: 1. Paroxysmal afib/flutter  Just documented with 12 lead EKG yesterday but present by Apple watch for one year He is usually asymptomatic with these short lived episodes with low v rates, rarely did I see v rates get to 100 bpm on review of apple strips  General education re afib and triggers discussed He will cut back on caffeine and alcohol use Echo scheduled  Due to infrequent episodes with low v rates will not prescribe daily rate control I have rx'ed 30 mg cardizem if he has sustained afib with v rates over 100 He would be a good candidate for front line ablation if his burden increases  To the point that it is interfering  with his QOL as he has bradycardia at baseline and most meds would lower his heart rate in SR asa well in afib He was congratulated on his regular exercise program   2. CHA2DS2VASc score of 0 He does not require anticoagulation by guidelines   I will call results of his echo and he will notify the office if he has increase in afib that would indicate  a change in  treatment  Butch Penny C. Ghada Abbett, [facility] [address] [place], [place] [address] [phone]

## 2020-02-21 ENCOUNTER — Other Ambulatory Visit: Payer: Self-pay

## 2020-02-21 DIAGNOSIS — R7989 Other specified abnormal findings of blood chemistry: Secondary | ICD-10-CM

## 2020-03-04 ENCOUNTER — Ambulatory Visit (HOSPITAL_COMMUNITY)
Admission: RE | Admit: 2020-03-04 | Discharge: 2020-03-04 | Disposition: A | Discharge status: Home / Self Care | Payer: BC Managed Care – PPO | Source: Ambulatory Visit | Attending: Nurse Practitioner | Admitting: Nurse Practitioner

## 2020-03-04 ENCOUNTER — Other Ambulatory Visit: Payer: Self-pay

## 2020-03-04 DIAGNOSIS — E785 Hyperlipidemia, unspecified: Secondary | ICD-10-CM | POA: Insufficient documentation

## 2020-03-04 DIAGNOSIS — I081 Rheumatic disorders of both mitral and tricuspid valves: Secondary | ICD-10-CM | POA: Insufficient documentation

## 2020-03-04 DIAGNOSIS — I480 Paroxysmal atrial fibrillation: Secondary | ICD-10-CM | POA: Diagnosis not present

## 2020-03-04 HISTORY — PX: TRANSTHORACIC ECHOCARDIOGRAM: SHX275

## 2020-03-04 LAB — ECHOCARDIOGRAM COMPLETE
Area-P 1/2: 3.03 cm2
S' Lateral: 4.3 cm

## 2020-03-04 NOTE — Progress Notes (Signed)
  Echocardiogram 2D Echocardiogram has been performed.  Thomas Hubbard 03/04/2020, 10:42 AM

## 2020-03-05 ENCOUNTER — Ambulatory Visit (INDEPENDENT_AMBULATORY_CARE_PROVIDER_SITE_OTHER): Payer: BC Managed Care – PPO

## 2020-03-05 ENCOUNTER — Other Ambulatory Visit (HOSPITAL_COMMUNITY): Payer: Self-pay | Admitting: *Deleted

## 2020-03-05 ENCOUNTER — Other Ambulatory Visit: Payer: Self-pay

## 2020-03-05 DIAGNOSIS — I519 Heart disease, unspecified: Secondary | ICD-10-CM

## 2020-03-05 DIAGNOSIS — R7989 Other specified abnormal findings of blood chemistry: Secondary | ICD-10-CM

## 2020-03-05 LAB — BASIC METABOLIC PANEL
BUN: 29 mg/dL — ABNORMAL HIGH (ref 6–23)
CO2: 27 mEq/L (ref 19–32)
Calcium: 9.3 mg/dL (ref 8.4–10.5)
Chloride: 104 mEq/L (ref 96–112)
Creatinine, Ser: 1.28 mg/dL (ref 0.40–1.50)
GFR: 60.69 mL/min (ref >60.00)
Glucose, Bld: 84 mg/dL (ref 70–99)
Potassium: 4.5 mEq/L (ref 3.5–5.1)
Sodium: 137 mEq/L (ref 135–145)

## 2020-03-09 ENCOUNTER — Encounter: Payer: Self-pay | Admitting: Family Medicine

## 2020-03-09 NOTE — Telephone Encounter (Signed)
Just an FYI

## 2020-03-16 ENCOUNTER — Encounter: Payer: Self-pay | Admitting: Family Medicine

## 2020-03-16 ENCOUNTER — Other Ambulatory Visit: Payer: Self-pay

## 2020-03-16 ENCOUNTER — Ambulatory Visit (INDEPENDENT_AMBULATORY_CARE_PROVIDER_SITE_OTHER): Payer: BC Managed Care – PPO | Admitting: Family Medicine

## 2020-03-16 VITALS — BP 106/65 | HR 54 | Temp 97.5°F | Resp 16 | Ht 74.75 in | Wt 211.6 lb

## 2020-03-16 DIAGNOSIS — I4891 Unspecified atrial fibrillation: Secondary | ICD-10-CM

## 2020-03-16 DIAGNOSIS — Z125 Encounter for screening for malignant neoplasm of prostate: Secondary | ICD-10-CM | POA: Diagnosis not present

## 2020-03-16 DIAGNOSIS — E7800 Pure hypercholesterolemia, unspecified: Secondary | ICD-10-CM | POA: Diagnosis not present

## 2020-03-16 DIAGNOSIS — R7303 Prediabetes: Secondary | ICD-10-CM

## 2020-03-16 DIAGNOSIS — Z0000 Encounter for general adult medical examination without abnormal findings: Secondary | ICD-10-CM | POA: Diagnosis not present

## 2020-03-16 NOTE — Progress Notes (Signed)
Office Note 03/16/2020  CC:  Chief Complaint  Patient presents with  . Annual Exam    Pt is not fasting    HPI:  Thomas Hubbard is a 58 y.o. White male who is here for annual health maintenance exam and f/u prediabetes, a-fib, and HLD.  A-fib: saw a-fib clinic, cardizem erx'd for prn use, no anticoag. Echo showed decreased EF->see PSH section for details. Has appt with [facility]ol at [facility] that  Has appt with Dr. Irish Lack soon as well. He has cut out caffeine completely. Very rare alc. Still exercising a lot. Feels well, no palpitations or heart racing or dizziness or fatigue.   HLD: tolerating daily statin. Prediab: his diet and exercise habits are stellar.  Past Medical History:  Diagnosis Date  . Adenomatous colon polyp 2011; 03/2015  . Basal cell carcinoma 04/2017   L scapula  . Diverticulosis   . Extensor tenosynovitis of right wrist 06/2017   Dr. Tamala Julian.  GREAT response to steroid injection.  . Hyperlipidemia 2012   TLC helpful initially, but eventually started atorva 03/2019 and had good response.  . Left shoulder pain 02/2016   ? calcific bursitis: Dr. Bing Neighbors NSAIDs.  Subacromial bursitis/impingement syndrome dx'd at f/u with Dr. Lavonna Rua injection given.  No improvement: MRI showed small insertional RC tear and adhesive capsulitis--steroid injection done again, PT and steroid injections helping as of 06/22/16.  . Low back pain   . PAF (paroxysmal atrial fibrillation) ([place]) 02/2020   CHADSVASC score= 0 as of 02/2020-->no anticoag.  A-fib clinic eval 02/20/20.  . Prediabetes 02/15/2016   HbA1c 6.1%.  A1c 6.3% 03/2017.  TLC-->A1c hanging around 6.1-6.3 as of 06/2019 (on no med).  . Right shoulder pain    subacromial bursitis and AC joint arth: steroid inj's by Dr. Tamala Julian.  Adhesive capsulitis 09/2018.  Marland Kitchen TFCC (triangular fibrocartilage complex) tear    degenerative, right    Past Surgical History:  Procedure Laterality Date  . BASAL CELL CARCINOMA  EXCISION    . COLONOSCOPY W/ POLYPECTOMY  10/2009; 03/2015   2011 had 1 polyp, 2016 had 2 polyps: recall 5 yrs  . LUMBAR DISC SURGERY  03/2010   L4-5  . SHOULDER SURGERY Left 07/2016   Lysis of adhesions (calcific bursitis), bone spur removal, distal clavicle excision.  . TRANSTHORACIC ECHOCARDIOGRAM  03/04/2020   In the setting of new dx a-fib: EF 45-50%, mild dilat LV, mod dilat LA, global LV hypokin-->Dr. Croituru to see    Family History  Problem Relation Age of Onset  . Cancer Mother 71       Colon  . Hyperlipidemia Father     Social History   Socioeconomic History  . Marital status: Married    Spouse name: Not on file  . Number of children: 3  . Years of education: Not on file  . Highest education level: Not on file  Occupational History  . Occupation: Banker: [employer]  Tobacco Use  . Smoking status: Never Smoker  . Smokeless tobacco: Never Used  Vaping Use  . Vaping Use: Never used  Substance and Sexual Activity  . Alcohol use: Yes    Alcohol/week: 2.0 standard drinks    Types: 2 Glasses of wine per week  . Drug use: No  . Sexual activity: Not on file  Other Topics Concern  . Not on file  Social History Narrative   Married, 3 grown children.   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   No T/A/Ds.      Competetive swimmer for many years, still swims as his primary form of exercise.      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 atorvastatin (LIPITOR) 20 MG tablet Take 1  tablet (20 mg total) by mouth daily. 90 tablet 3  . Misc Natural Products (TART CHERRY ADVANCED PO) Take by mouth daily.     . Multiple Vitamins-Minerals (MULTIVITAMIN ADULT PO) Take by mouth daily.    . Turmeric 500 MG CAPS Take by mouth daily.     Marland Kitchen diltiazem (CARDIZEM) 30 MG tablet Take 1/2-1 tablet every 4 hours AS NEEDED for heart rate >100 as long as blood pressure >100. (Patient not taking: Reported on 03/16/2020) 45 tablet 1   No facility-administered medications prior to visit.    Allergies  Allergen Reactions  . Poison Ivy Extract Itching and Rash    ROS Review of Systems  Constitutional: Negative for appetite change, chills, fatigue and fever.  HENT: Negative for congestion, dental problem, ear pain and sore throat.   Eyes: Negative for discharge, redness and visual disturbance.  Respiratory: Negative for cough, chest tightness, shortness of breath and wheezing.   Cardiovascular: Negative for chest pain, palpitations and leg swelling.  Gastrointestinal: Negative for abdominal pain, blood in stool, diarrhea, nausea and vomiting.  Genitourinary: Negative for difficulty urinating, dysuria, flank pain, frequency, hematuria and urgency.  Musculoskeletal: Negative for arthralgias, back pain, joint swelling, myalgias and neck stiffness.  Skin: Negative for pallor and rash.  Neurological: Negative for dizziness, speech difficulty, weakness and headaches.  Hematological: Negative for adenopathy. Does not bruise/bleed easily.  Psychiatric/Behavioral: Negative for confusion and sleep disturbance. The patient is not nervous/anxious.     PE; Vitals with BMI 03/16/2020 02/20/2020 02/19/2020  Height 6' 2.75" 6\' 2"  6\' 2"   Weight 211 lbs 10 oz 209 lbs 208 lbs 13 oz  BMI 26.62 21.30 86.5  Systolic 784 696 92  Diastolic 65 68 59  Pulse 54 58 77    Gen: Alert, well appearing.  Patient is oriented to person, place, time, and situation. AFFECT: pleasant, lucid thought and speech. ENT:  Ears: EACs clear, normal epithelium.  TMs with good light reflex and landmarks bilaterally.  Eyes: no injection, icteris, swelling, or exudate.  EOMI, PERRLA. Nose: no drainage or turbinate edema/swelling.  No injection or focal lesion.  Mouth: lips without lesion/swelling.  Oral mucosa pink and moist.  Dentition intact and without obvious caries or gingival swelling.  Oropharynx without erythema, exudate, or swelling.  Neck: supple/nontender.  No LAD, mass, or TM.  Carotid pulses 2+ bilaterally, without bruits. CV: RRR, no m/r/g.   LUNGS: CTA bilat, nonlabored resps, good aeration in all lung fields. ABD: soft, NT, ND, BS normal.  No hepatospenomegaly or mass.  No bruits. EXT: no clubbing, cyanosis, or edema.  Musculoskeletal: no joint swelling, erythema, warmth, or tenderness.  ROM of all joints intact. Skin - no sores or suspicious lesions or rashes or color changes   Pertinent labs:  Lab Results  Component Value Date   TSH 1.59 02/19/2020   Lab Results  Component Value Date   WBC 7.8 02/19/2020   HGB 15.4 02/19/2020   HCT 46.9 02/19/2020   MCV 96.5 02/19/2020   PLT 217.0 02/19/2020   Lab Results  Component Value Date   CREATININE 1.28 03/05/2020   BUN 29 (H) 03/05/2020   NA 137 03/05/2020   K 4.5 03/05/2020   CL 104 03/05/2020   CO2 27 03/05/2020   Lab Results  Component Value Date   ALT 21 03/15/2019   AST 19 03/15/2019   ALKPHOS 61 03/15/2019   BILITOT 1.0 03/15/2019   Lab Results  Component Value Date   CHOL 151 06/17/2019   Lab Results  Component Value Date   HDL 44.40 06/17/2019   Lab Results  Component Value Date   LDLCALC 92 06/17/2019   Lab Results  Component Value Date   TRIG 72.0 06/17/2019   Lab Results  Component Value Date   CHOLHDL 3 06/17/2019   Lab Results  Component Value Date   PSA 1.13 03/15/2019   PSA 0.93 03/13/2018   PSA 1.15 03/08/2017   Lab Results  Component Value Date   HGBA1C 5.8 (A) 09/24/2019   HGBA1C 5.8 09/24/2019    HGBA1C 5.8 09/24/2019   HGBA1C 5.8 09/24/2019   ASSESSMENT AND PLAN:   1) HLD: tolerating statin. FLP and hepatic panel--future.  2) Prediabetes: excellent diet and exercise. BMI holding steady at 26. Fasting gluc and Hba1c --future.  3) PAF: asymptomatic. Normal rhythm today. Has cardizem for prn use. CHADsVaSc=0.  No anticoag. Recent echo with mildly dec LV function-->has a cards appt at [facility] soon as well as with Dr. Irish Lack soon.  4) Health maintenance exam: Reviewed age and gender appropriate health maintenance issues (prudent diet, regular exercise, health risks of tobacco and excessive alcohol, use of seatbelts, fire alarms in home, use of sunscreen).  Also reviewed age and gender appropriate health screening as well as vaccine recommendations. Vaccines:  ALL UTD. Labs: fasting HP, Hba1c PSA. Prostate ca screening: PSA today. Colon ca screening: next colonoscopy due any time now (as of 03/2020)-->he'll arrange this when his current cardiac issues are fully addressed.  An After Visit Summary was printed and given to the patient.  FOLLOW UP:  Return in about 6 months (around 09/14/2020) for routine chronic illness f/u; plus fasting lab appt at his earliest convenience.  Signed:  Crissie Sickles, MD           03/16/2020

## 2020-03-16 NOTE — Patient Instructions (Signed)

## 2020-03-17 ENCOUNTER — Ambulatory Visit (INDEPENDENT_AMBULATORY_CARE_PROVIDER_SITE_OTHER): Payer: BC Managed Care – PPO

## 2020-03-17 DIAGNOSIS — I4891 Unspecified atrial fibrillation: Secondary | ICD-10-CM

## 2020-03-17 DIAGNOSIS — Z125 Encounter for screening for malignant neoplasm of prostate: Secondary | ICD-10-CM

## 2020-03-17 DIAGNOSIS — E7800 Pure hypercholesterolemia, unspecified: Secondary | ICD-10-CM | POA: Diagnosis not present

## 2020-03-17 DIAGNOSIS — R7303 Prediabetes: Secondary | ICD-10-CM | POA: Diagnosis not present

## 2020-03-17 LAB — COMPREHENSIVE METABOLIC PANEL
ALT: 13 U/L (ref 0–53)
AST: 15 U/L (ref 0–37)
Albumin: 4 g/dL (ref 3.5–5.2)
Alkaline Phosphatase: 53 U/L (ref 39–117)
BUN: 17 mg/dL (ref 6–23)
CO2: 28 mEq/L (ref 19–32)
Calcium: 9.1 mg/dL (ref 8.4–10.5)
Chloride: 107 mEq/L (ref 96–112)
Creatinine, Ser: 1.16 mg/dL (ref 0.40–1.50)
GFR: 68.28 mL/min (ref >60.00)
Glucose, Bld: 96 mg/dL (ref 70–99)
Potassium: 4.4 mEq/L (ref 3.5–5.1)
Sodium: 141 mEq/L (ref 135–145)
Total Bilirubin: 0.7 mg/dL (ref 0.2–1.2)
Total Protein: 6.7 g/dL (ref 6.0–8.3)

## 2020-03-17 LAB — CBC WITH DIFFERENTIAL/PLATELET
Basophils Absolute: 0 10*3/uL (ref 0.0–0.1)
Basophils Relative: 0.6 % (ref 0.0–3.0)
Eosinophils Absolute: 0.1 10*3/uL (ref 0.0–0.7)
Eosinophils Relative: 1.8 % (ref 0.0–5.0)
HCT: 41.4 % (ref 39.0–52.0)
Hemoglobin: 13.8 g/dL (ref 13.0–17.0)
Lymphocytes Relative: 22.6 % (ref 12.0–46.0)
Lymphs Abs: 1.6 10*3/uL (ref 0.7–4.0)
MCHC: 33.4 g/dL (ref 30.0–36.0)
MCV: 95.5 fl (ref 78.0–100.0)
Monocytes Absolute: 0.4 10*3/uL (ref 0.1–1.0)
Monocytes Relative: 6.1 % (ref 3.0–12.0)
Neutro Abs: 4.8 10*3/uL (ref 1.4–7.7)
Neutrophils Relative %: 68.9 % (ref 43.0–77.0)
Platelets: 202 10*3/uL (ref 150.0–400.0)
RBC: 4.33 Mil/uL (ref 4.22–5.81)
RDW: 13.9 % (ref 11.5–15.5)
WBC: 7 10*3/uL (ref 4.0–10.5)

## 2020-03-17 LAB — PSA: PSA: 0.89 ng/mL (ref 0.10–4.00)

## 2020-03-17 LAB — LIPID PANEL
Cholesterol: 135 mg/dL (ref 0–200)
HDL: 50.9 mg/dL (ref >39.00)
LDL Cholesterol: 70 mg/dL (ref 0–99)
NonHDL: 83.73
Total CHOL/HDL Ratio: 3
Triglycerides: 69 mg/dL (ref 0.0–149.0)
VLDL: 13.8 mg/dL (ref 0.0–40.0)

## 2020-03-17 LAB — TSH: TSH: 1.7 u[IU]/mL (ref 0.35–4.50)

## 2020-03-17 LAB — HEMOGLOBIN A1C: Hgb A1c MFr Bld: 6.4 % (ref 4.6–6.5)

## 2020-03-18 ENCOUNTER — Encounter: Payer: Self-pay | Admitting: Family Medicine

## 2020-03-18 DIAGNOSIS — I4892 Unspecified atrial flutter: Secondary | ICD-10-CM | POA: Diagnosis not present

## 2020-03-18 DIAGNOSIS — I480 Paroxysmal atrial fibrillation: Secondary | ICD-10-CM | POA: Diagnosis not present

## 2020-04-01 ENCOUNTER — Ambulatory Visit: Payer: BC Managed Care – PPO | Admitting: Cardiovascular Disease

## 2020-05-14 ENCOUNTER — Other Ambulatory Visit: Payer: Self-pay

## 2020-05-15 ENCOUNTER — Ambulatory Visit: Payer: 59 | Admitting: Family Medicine

## 2020-05-15 ENCOUNTER — Encounter: Payer: Self-pay | Admitting: Family Medicine

## 2020-05-15 VITALS — BP 111/68 | HR 59 | Temp 97.6°F | Resp 16 | Ht 74.75 in | Wt 209.2 lb

## 2020-05-15 DIAGNOSIS — I480 Paroxysmal atrial fibrillation: Secondary | ICD-10-CM

## 2020-05-15 DIAGNOSIS — L0202 Furuncle of face: Secondary | ICD-10-CM | POA: Diagnosis not present

## 2020-05-15 MED ORDER — MUPIROCIN 2 % EX OINT: 1.0000 "application " | TOPICAL_OINTMENT | Freq: Three times a day (TID) | CUTANEOUS | 0 refills | Status: DC

## 2020-05-15 MED ORDER — AMOXICILLIN-POT CLAVULANATE 875-125 MG PO TABS: 1.0000 | ORAL_TABLET | Freq: Two times a day (BID) | ORAL | 0 refills | Status: DC

## 2020-05-15 NOTE — Progress Notes (Signed)
OFFICE VISIT  05/15/2020  CC:  Chief Complaint  Patient presents with  . Raised nodule     Located on left side of jaw-line, noticeable redness. Occurring 3 days ago   HPI:    Patient is a 60 y.o. Caucasian male who presents for "raised nodule on L side of jaw". Onset 2 nights ago with small "pimple" type lesion at angle of L jaw, gradually enlarging and getting more red.  No pain initially but sore as enlarging.  Small amount of clear fluid on skin this morning. Bacitracin applied.   No fevers or body aches. Has some tiredness that is not usual for him.  No n/v, no abd pain, no dizziness, no HA or oral pain.  Past Medical History:  Diagnosis Date  . Adenomatous colon polyp 2011; 03/2015  . Basal cell carcinoma 04/2017   L scapula  . Diverticulosis   . Extensor tenosynovitis of right wrist 06/2017   Dr. Tamala Julian.  GREAT response to steroid injection.  . Hyperlipidemia 2012   TLC helpful initially, but eventually started atorva 03/2019 and had good response.  . Left shoulder pain 02/2016   ? calcific bursitis: Dr. Bing Neighbors NSAIDs.  Subacromial bursitis/impingement syndrome dx'd at f/u with Dr. Lavonna Rua injection given.  No improvement: MRI showed small insertional RC tear and adhesive capsulitis--steroid injection done again, PT and steroid injections helping as of 06/22/16.  . Low back pain   . PAF (paroxysmal atrial fibrillation) ([place]) 02/2020   CHADSVASC score= 0 as of 02/2020-->no anticoag.  A-fib clinic eval 02/20/20.  . Prediabetes 02/15/2016   HbA1c 6.1%.  A1c 6.3% 03/2017.  TLC-->A1c hanging around 6.1-6.3 as of 06/2019 (on no med).  . Right shoulder pain    subacromial bursitis and AC joint arth: steroid inj's by Dr. Tamala Julian.  Adhesive capsulitis 09/2018.  Marland Kitchen TFCC (triangular fibrocartilage complex) tear    degenerative, right    Past Surgical History:  Procedure Laterality Date  . BASAL CELL CARCINOMA EXCISION    . COLONOSCOPY W/ POLYPECTOMY  10/2009; 03/2015    2011 had 1 polyp, 2016 had 2 polyps: recall 5 yrs  . LUMBAR DISC SURGERY  03/2010   L4-5  . SHOULDER SURGERY Left 07/2016   Lysis of adhesions (calcific bursitis), bone spur removal, distal clavicle excision.  . TRANSTHORACIC ECHOCARDIOGRAM  03/04/2020   In the setting of new dx a-fib: EF 45-50%, mild dilat LV, mod dilat LA, global LV hypokin-->Dr. Croituru to see    Outpatient Medications Prior to Visit  Medication Sig Dispense Refill  . atorvastatin (LIPITOR) 20 MG tablet Take 1 tablet (20 mg total) by mouth daily. 90 tablet 3  . Misc Natural Products (TART CHERRY ADVANCED PO) Take by mouth daily.     . Multiple Vitamins-Minerals (MULTIVITAMIN ADULT PO) Take by mouth daily.    . Turmeric 500 MG CAPS Take by mouth daily.     Marland Kitchen apixaban (ELIQUIS) 5 MG TABS tablet Take by mouth. (Patient not taking: Reported on 05/15/2020)    . diltiazem (CARDIZEM) 30 MG tablet Take 1/2-1 tablet every 4 hours AS NEEDED for heart rate >100 as long as blood pressure >100. (Patient not taking: No sig reported) 45 tablet 1   No facility-administered medications prior to visit.    Allergies  Allergen Reactions  . Poison Ivy Extract Itching and Rash    ROS As per HPI  PE: Vitals with BMI 05/15/2020 03/16/2020 02/20/2020  Height 6' 2.75" 6' 2.75" 6\' 2"   Weight 209 lbs 3  oz 211 lbs 10 oz 209 lbs  BMI 26.31 29.56 21.30  Systolic 865 784 696  Diastolic 68 65 68  Pulse 59 54 58   Gen: Alert, well appearing.  Patient is oriented to person, place, time, and situation. AFFECT: pleasant, lucid thought and speech. Overlying L mandible near angle he has 2-3 cm flunctuant sub Q nodularity with overlying erythema, punctate drainage site closed/dry.  Mild TTP. No LAD is palpable.  Cannot palpate submandib gland.  LABS:    Chemistry      Component Value Date/Time   NA 141 03/17/2020 0917   NA 139 02/15/2016 0000   K 4.4 03/17/2020 0917   CL 107 03/17/2020 0917   CO2 28 03/17/2020 0917   BUN 17  03/17/2020 0917   BUN 17 02/15/2016 0000   CREATININE 1.16 03/17/2020 0917   GLU 106 02/15/2016 0000      Component Value Date/Time   CALCIUM 9.1 03/17/2020 0917   ALKPHOS 53 03/17/2020 0917   AST 15 03/17/2020 0917   ALT 13 03/17/2020 0917   BILITOT 0.7 03/17/2020 0917     Lab Results  Component Value Date   HGBA1C 6.4 03/17/2020   Lab Results  Component Value Date   CHOL 135 03/17/2020   HDL 50.90 03/17/2020   LDLCALC 70 03/17/2020   TRIG 69.0 03/17/2020   CHOLHDL 3 03/17/2020    IMPRESSION AND PLAN:  1) L cheek furuncle, suspect staph or strep. (Fortunately this lesion IS NOT his parotid gland, submandib gland, or a lymph node). Augmentin 875 bid x 10d. Warm compress to area 20 min bid-tid to encourage drainage. Bactroban ointment tid.  2) PAF/Flutter: he updated me today about his specialist visits. Dr. Veverly Fells, sports med cardiologist-->dx'd him with Athelete's heart syndrome and is not worried about the pt's LV efficiency. Dr. Veverly Fells has discussed some options regarding treatment approach to his afib/flutter but also wanted him to see an electrophysiologist to get their opinion, so pt is currently waiting to see the electrophysiologist.   Currently he feels some palp's/notes a-fib on his apple watch only if he's had a few days in a row of decreased sleep, and usually it lasts only a few hours, often he can get it to resolve by swimming and getting his HR up. He has cardizem for "pill in pocket" approach, also has eliquis to take if staying in fib/flutter for >24 hr.  An After Visit Summary was printed and given to the patient.  FOLLOW UP: Return if symptoms worsen or fail to improve.  Signed:  Crissie Sickles, MD           05/15/2020

## 2020-05-18 ENCOUNTER — Ambulatory Visit (INDEPENDENT_AMBULATORY_CARE_PROVIDER_SITE_OTHER): Payer: 59 | Admitting: Family Medicine

## 2020-05-18 ENCOUNTER — Encounter: Payer: Self-pay | Admitting: Family Medicine

## 2020-05-18 ENCOUNTER — Other Ambulatory Visit: Payer: Self-pay

## 2020-05-18 ENCOUNTER — Telehealth: Payer: Self-pay

## 2020-05-18 VITALS — BP 105/69 | HR 67 | Temp 97.9°F | Resp 16 | Ht 74.75 in | Wt 209.2 lb

## 2020-05-18 DIAGNOSIS — Z860100 Personal history of colon polyps, unspecified: Secondary | ICD-10-CM | POA: Insufficient documentation

## 2020-05-18 DIAGNOSIS — L0202 Furuncle of face: Secondary | ICD-10-CM | POA: Diagnosis not present

## 2020-05-18 NOTE — Telephone Encounter (Signed)
4Pm in person today or 8:30 AM tomorrow in person.

## 2020-05-18 NOTE — Telephone Encounter (Signed)
[facility] - Client TELEPHONE ADVICE RECORD [facility] Patient Name: RONY RATZ Gender: Male DOB: April 26, 1960 Age: 60 Y 17 D Return Phone Number: [phone] (Primary), [phone] (Secondary) Address: City/State/Zip: [place] [address] Client [facility] - Client Client Site [place] - Day Physician Crissie Sickles - MD Contact Type Call Who Is Calling Patient / Member / Family / Caregiver Call Type Triage / Clinical Relationship To Patient Self Return Phone Number [phone] (Secondary) Chief Complaint Fever (non urgent symptom) (> THREE MONTHS) Reason for Call Symptomatic / Request for [place] states that he was in on Friday and has a strep or staph infection and has been taking antibiotics. He has a fever 99.7 Translation No Nurse Assessment Nurse: Harvie Bridge, RN, Beth Date/Time (Eastern Time): 05/16/2020 4:53:32 PM Confirm and document reason for call. If symptomatic, describe symptoms. ---Caller states that he was in on Friday and has a staph infection - has sore on jawline which is filled with pus, and has been taking antibiotics - oral is 875/125mg  amoxcillin/clauv / Topical muprison 2 % . He has a fever 99.7. Does the patient have any new or worsening symptoms? ---Yes Will a triage be completed? ---Yes Related visit to physician within the last 2 weeks? ---Yes Does the PT have any chronic conditions? (i.e. diabetes, asthma, this includes High risk factors for pregnancy, etc.) ---No Is this a behavioral health or substance abuse call? ---No Guidelines Guideline Title Affirmed Question Affirmed Notes Nurse Date/Time (Eastern Time) Boil (Skin Abscess) on Treatment Follow-up Call Center of the boil has become soft or puscolored Newhart, RN, [facility] 05/16/2020 4:56:47 PM Disp. Time Eilene Ghazi Time) Disposition Final User 05/16/2020 4:42:34 PM Attempt made - message left  Newhart, RN, [facility]ustaqu[facility] Maize 05/16/2020 5:00:24 PM See PCP within 24 Hours Yes Newhart, RN, Personal assistant Disagree/Comply Comply PLEASE NOTE: All timestamps contained within this report are represented as [place] Standard Time. CONFIDENTIALTY NOTICE: This fax transmission is intended only for the addressee. It contains information that is legally privileged, confidential or otherwise protected from use or disclosure. If you are not the intended recipient, you are strictly prohibited from reviewing, disclosing, copying using or disseminating any of this information or taking any action in reliance on or regarding this information. If you have received this fax in error, please notify us immediately by telephone so that we can arrange for its return to [place]. Phone: [phone], Toll-Free: [phone], Fax: [phone] Page: 2 of 2 Call Id: [id number] [place] Understands Yes PreDisposition Did not know what to do Care Advice Given Per Guideline SEE PCP WITHIN 24 HOURS: * IF OFFICE WILL BE CLOSED: Your child needs to be examined within the next 24 hours. A clinic or an urgent care center is often a good source of care if your doctor's office is closed or you can't get an appointment. DRAINING THE BOIL: * The main treatment of boils is to open them and drain the pus. * The boil is now ready for draining * This must always be done by a doctor. * After draining, the boil will usually heal on its own. AVOID SQUEEZING OR OPENING THE BOIL YOURSELF: * Trying to open a boil yourself PUS PRECAUTIONS - PREVENTION OF SPREAD: * Pus or other drainage from an open boil is very contagious. * Once a boil is opened it will drain pus for 3 to 4 days and then heal up. * Cover a draining boil with a clean,  dry bandage (usually a 4 by 4 inch gauze pad and tape) * Change the bandage at least twice daily. * Wash the area with an antibacterial soap each time. * Carefully dispose of the bandage into the regular trash. * Continue the  antibiotic. CONTINUE ANTIBIOTIC: * Also, continue any other treatment instructions from your child's doctor. * For fever above 102 F (39 C), you may use acetaminophen OR ibuprofen (See Dosage table). FEVER MEDICINE AND TREATMENT: PAIN MEDICINE: * For pain, give acetaminophen every 4 hours OR ibuprofen every 6 hours as needed. (See Dosage table). CALL BACK IF: * Your child becomes worse CARE ADVICE given per Boil (Skin Abscess) on Treatment Follow-up Call (Pediatric) guideline. Referrals GO TO FACILITY UNDECIDED

## 2020-05-18 NOTE — Progress Notes (Signed)
OFFICE VISIT  05/18/2020  CC:  Chief Complaint  Patient presents with  . Enlarged nodule    Drained out 1 tsp and 1/2 this morning, still using mupirocin and Augmentin as prescribed from 2/11 visit    HPI:    Patient is a 60 y.o. Caucasian male who presents for enlarging L cheek furuncle. I saw him initially for this 3 d/a and rx'd augmentin and bactroban, recommended warm compress.  INTERIM HX: Doing compresses 4 times a day very well, expressing contents. It took a couple days to get results and in the meantime he got T 100 and the area got a bit bigger.  Last 24h has improved, drained some, no further elevated temps. Taking augmentin and applying bactroban fine.   Past Medical History:  Diagnosis Date  . Adenomatous colon polyp 2011; 03/2015  . Basal cell carcinoma 04/2017   L scapula  . Diverticulosis   . Extensor tenosynovitis of right wrist 06/2017   Dr. Tamala Julian.  GREAT response to steroid injection.  . Hyperlipidemia 2012   TLC helpful initially, but eventually started atorva 03/2019 and had good response.  . Left shoulder pain 02/2016   ? calcific bursitis: Dr. Bing Neighbors NSAIDs.  Subacromial bursitis/impingement syndrome dx'd at f/u with Dr. Lavonna Rua injection given.  No improvement: MRI showed small insertional RC tear and adhesive capsulitis--steroid injection done again, PT and steroid injections helping as of 06/22/16.  . Low back pain   . PAF (paroxysmal atrial fibrillation) ([place]) 02/2020   CHADSVASC score= 0.  Dr. Veverly Fells, sports med cardiology specialist at [facility] dx'd pt with Athelete heart syndrome.  As of 05/2020 pt to see electrophysiologist before getting back with Dr. Veverly Fells to decide what to do for ultimate tx.  . Prediabetes 02/15/2016   HbA1c 6.1%.  A1c 6.3% 03/2017.  TLC-->A1c hanging around 6.1-6.3 as of 06/2019 (on no med).  . Right shoulder pain    subacromial bursitis and AC joint arth: steroid inj's by Dr. Tamala Julian.  Adhesive capsulitis 09/2018.  Marland Kitchen  TFCC (triangular fibrocartilage complex) tear    degenerative, right    Past Surgical History:  Procedure Laterality Date  . BASAL CELL CARCINOMA EXCISION    . COLONOSCOPY W/ POLYPECTOMY  10/2009; 03/2015   2011 had 1 polyp, 2016 had 2 polyps: recall 5 yrs  . LUMBAR DISC SURGERY  03/2010   L4-5  . SHOULDER SURGERY Left 07/2016   Lysis of adhesions (calcific bursitis), bone spur removal, distal clavicle excision.  . TRANSTHORACIC ECHOCARDIOGRAM  03/04/2020   In the setting of new dx a-fib: EF 45-50%, mild dilat LV, mod dilat LA, global LV hypokin-->Dr. Croituru to see    Outpatient Medications Prior to Visit  Medication Sig Dispense Refill  . amoxicillin-clavulanate (AUGMENTIN) 875-125 MG tablet Take 1 tablet by mouth 2 (two) times daily. 20 tablet 0  . atorvastatin (LIPITOR) 20 MG tablet Take 1 tablet (20 mg total) by mouth daily. 90 tablet 3  . Misc Natural Products (TART CHERRY ADVANCED PO) Take by mouth daily.     . Multiple Vitamins-Minerals (MULTIVITAMIN ADULT PO) Take by mouth daily.    . mupirocin ointment (BACTROBAN) 2 % Apply 1 application topically 3 (three) times daily. 15 g 0  . Turmeric 500 MG CAPS Take by mouth daily.     Marland Kitchen diltiazem (CARDIZEM) 30 MG tablet Take 1/2-1 tablet every 4 hours AS NEEDED for heart rate >100 as long as blood pressure >100. (Patient not taking: No sig reported) 45 tablet 1  .  apixaban (ELIQUIS) 5 MG TABS tablet Take by mouth. (Patient not taking: No sig reported)     No facility-administered medications prior to visit.    Allergies  Allergen Reactions  . Poison Ivy Extract Itching and Rash    ROS As per HPI  PE: Vitals with BMI 05/18/2020 05/15/2020 03/16/2020  Height 6' 2.75" 6' 2.75" 6' 2.75"  Weight 209 lbs 3 oz 209 lbs 3 oz 211 lbs 10 oz  BMI 26.31 93.57 01.77  Systolic 939 030 092  Diastolic 69 68 65  Pulse 67 59 54     Gen: Alert, well appearing.  Patient is oriented to person, place, time, and situation. AFFECT: pleasant,  lucid thought and speech.  3 cm oval furuncle region overlying L mandible,+erythema, mildly tender, central drainage tract opening is present w/out active drainage.  Some fluctuance present but some of the area has some indurated/edematous feeling now.  No  Neck adenopathy.   LABS:    Chemistry      Component Value Date/Time   NA 141 03/17/2020 0917   NA 139 02/15/2016 0000   K 4.4 03/17/2020 0917   CL 107 03/17/2020 0917   CO2 28 03/17/2020 0917   BUN 17 03/17/2020 0917   BUN 17 02/15/2016 0000   CREATININE 1.16 03/17/2020 0917   GLU 106 02/15/2016 0000      Component Value Date/Time   CALCIUM 9.1 03/17/2020 0917   ALKPHOS 53 03/17/2020 0917   AST 15 03/17/2020 0917   ALT 13 03/17/2020 0917   BILITOT 0.7 03/17/2020 0917     IMPRESSION AND PLAN:  Furuncle, left cheek, now draining/improving some. Continue current treatments: compresses/massage, augmentin, bactroban ointment. Culture swab given to pt to take home and get specimen from drainage and return to [place] tomorrow.  F/u to recheck wound 4d, earlier if new developments.  An After Visit Summary was printed and given to the patient.  FOLLOW UP: Return in about 4 days (around 05/22/2020) for f/u cheek.  Signed:  Crissie Sickles, MD           05/18/2020

## 2020-05-18 NOTE — Telephone Encounter (Signed)
Patient was last seen on Friday 2/11 regarding sore and given rx for Augmentin and mupirocin ointment. He is still using both but the sore had doubled in size, still swollen and developed a low grade fever over the weekend but is fever has gone. Currently draining and has been massaging the area to promote drainage. He would like to know if he needs to come back in for another evaluation or continue with plan.  Please advise, thanks.

## 2020-05-18 NOTE — Telephone Encounter (Signed)
Spoke with patient and advised of recommendations. Appt scheduled

## 2020-05-19 ENCOUNTER — Ambulatory Visit: Payer: 59

## 2020-05-19 DIAGNOSIS — L0202 Furuncle of face: Secondary | ICD-10-CM

## 2020-05-22 ENCOUNTER — Other Ambulatory Visit: Payer: Self-pay

## 2020-05-22 ENCOUNTER — Ambulatory Visit: Payer: 59 | Admitting: Family Medicine

## 2020-05-22 ENCOUNTER — Encounter: Payer: Self-pay | Admitting: Family Medicine

## 2020-05-22 VITALS — BP 109/66 | HR 50 | Temp 97.5°F | Resp 16 | Ht 74.75 in | Wt 213.8 lb

## 2020-05-22 DIAGNOSIS — L0202 Furuncle of face: Secondary | ICD-10-CM | POA: Diagnosis not present

## 2020-05-22 DIAGNOSIS — A4902 Methicillin resistant Staphylococcus aureus infection, unspecified site: Secondary | ICD-10-CM | POA: Diagnosis not present

## 2020-05-22 LAB — WOUND CULTURE
MICRO NUMBER:: 11538577
SPECIMEN QUALITY:: ADEQUATE

## 2020-05-22 MED ORDER — DOXYCYCLINE HYCLATE 100 MG PO CAPS: ORAL_CAPSULE | ORAL | 0 refills | Status: DC

## 2020-05-22 NOTE — Progress Notes (Signed)
OFFICE VISIT  05/22/2020  CC:  Chief Complaint  Patient presents with  . Follow-up    Left cheek furuncle    HPI:    Patient is a 60 y.o. Caucasian male who presents for 4d f/u L cheek furuncle. He is nearing the end of a 10d course of augmentin. His lesion continues to shrink but is not completely resolved.  No pain or fever but he still feels some fatigue.  Reviewed clx results from 05/19/20 that came back today (MRSA, sens to bactrim, clinda, and tetra).  Past Medical History:  Diagnosis Date  . Adenomatous colon polyp 2011; 03/2015  . Basal cell carcinoma 04/2017   L scapula  . Diverticulosis   . Extensor tenosynovitis of right wrist 06/2017   Dr. Tamala Julian.  GREAT response to steroid injection.  . Hyperlipidemia 2012   TLC helpful initially, but eventually started atorva 03/2019 and had good response.  . Left shoulder pain 02/2016   ? calcific bursitis: Dr. Bing Neighbors NSAIDs.  Subacromial bursitis/impingement syndrome dx'd at f/u with Dr. Lavonna Rua injection given.  No improvement: MRI showed small insertional RC tear and adhesive capsulitis--steroid injection done again, PT and steroid injections helping as of 06/22/16.  . Low back pain   . PAF (paroxysmal atrial fibrillation) ([place]) 02/2020   CHADSVASC score= 0.  Dr. Veverly Fells, sports med cardiology specialist at [facility] dx'd pt with Athelete heart syndrome.  As of 05/2020 pt to see electrophysiologist before getting back with Dr. Veverly Fells to decide what to do for ultimate tx.  . Prediabetes 02/15/2016   HbA1c 6.1%.  A1c 6.3% 03/2017.  TLC-->A1c hanging around 6.1-6.3 as of 06/2019 (on no med).  . Right shoulder pain    subacromial bursitis and AC joint arth: steroid inj's by Dr. Tamala Julian.  Adhesive capsulitis 09/2018.  Marland Kitchen TFCC (triangular fibrocartilage complex) tear    degenerative, right    Past Surgical History:  Procedure Laterality Date  . BASAL CELL CARCINOMA EXCISION    . COLONOSCOPY W/ POLYPECTOMY  10/2009; 03/2015    2011 had 1 polyp, 2016 had 2 polyps: recall 5 yrs  . LUMBAR DISC SURGERY  03/2010   L4-5  . SHOULDER SURGERY Left 07/2016   Lysis of adhesions (calcific bursitis), bone spur removal, distal clavicle excision.  . TRANSTHORACIC ECHOCARDIOGRAM  03/04/2020   In the setting of new dx a-fib: EF 45-50%, mild dilat LV, mod dilat LA, global LV hypokin-->Dr. Croituru to see    Outpatient Medications Prior to Visit  Medication Sig Dispense Refill  . atorvastatin (LIPITOR) 20 MG tablet Take 1 tablet (20 mg total) by mouth daily. 90 tablet 3  . Misc Natural Products (TART CHERRY ADVANCED PO) Take by mouth daily.     . Multiple Vitamins-Minerals (MULTIVITAMIN ADULT PO) Take by mouth daily.    . mupirocin ointment (BACTROBAN) 2 % Apply 1 application topically 3 (three) times daily. 15 g 0  . Turmeric 500 MG CAPS Take by mouth daily.     Marland Kitchen amoxicillin-clavulanate (AUGMENTIN) 875-125 MG tablet Take 1 tablet by mouth 2 (two) times daily. 20 tablet 0  . diltiazem (CARDIZEM) 30 MG tablet Take 1/2-1 tablet every 4 hours AS NEEDED for heart rate >100 as long as blood pressure >100. (Patient not taking: No sig reported) 45 tablet 1   No facility-administered medications prior to visit.    Allergies  Allergen Reactions  . Poison Ivy Extract Itching and Rash    ROS As per HPI  PE: Vitals with BMI 05/22/2020 05/18/2020  05/15/2020  Height 6' 2.75" 6' 2.75" 6' 2.75"  Weight 213 lbs 13 oz 209 lbs 3 oz 209 lbs 3 oz  BMI 26.89 09.62 83.66  Systolic 294 765 465  Diastolic 66 69 68  Pulse 50 67 59   Gen: Alert, well appearing.  Patient is oriented to person, place, time, and situation. AFFECT: pleasant, lucid thought and speech. L cheek focal swelling over mandible about 2 cm size, feels a bit indurated but not particularly fluctuant.  No tenderness.  Central punctate drainage opening w/out any active drainage.  No erythema or warmth. No LAD.  LABS:  Wound culture from L cheek furuncle 05/19/20->MRSA,  sensitive to clinda,bactrim, and tetracycline.   IMPRESSION AND PLAN:  L cheek furuncle, MRSA. D/c augmentin. Start doxycycline 100mg  bid x 10d, cautioned pt to minimize direct sun exposure while on this antibiotic. Cont bactroban ointment tid and continue warm compresses 20 min 2-3 times a day. Signs/symptoms to call or return for were reviewed and pt expressed understanding.  An After Visit Summary was printed and given to the patient.  FOLLOW UP: No follow-ups on file.  Signed:  Crissie Sickles, MD           05/22/2020

## 2020-05-28 ENCOUNTER — Encounter: Payer: Self-pay | Admitting: Family Medicine

## 2020-06-02 ENCOUNTER — Telehealth: Payer: Self-pay

## 2020-06-02 NOTE — Telephone Encounter (Signed)
Patient was to return in 2 weeks for follow up on MRSA.  Patient states there is still a lump.  Patient was due for follow up visit this week.  He is available Thursday this week from [place] - 1:30PM.  Will Dr. Anitra Lauth work patient in for an in office appt or virtual appt? Patient is aware Dr. Anitra Lauth gone for the day and will not have follow up call until tomorrow 3/2.  Please call patient at [phone] after Dr. Anitra Lauth reviews.

## 2020-06-02 NOTE — Telephone Encounter (Signed)
Pt was last seen 05/23/19, mentioned in note: "Start doxycycline 100mg  bid x 10d, cautioned pt to minimize direct sun exposure while on this antibiotic. Cont bactroban ointment tid and continue warm compresses 20 min 2-3 times a day."  Possible work in times for Thursday are 9:45, 10:15 or 10:45am. Please advise, thanks.

## 2020-06-02 NOTE — Telephone Encounter (Signed)
10:45

## 2020-06-03 NOTE — Telephone Encounter (Signed)
Called patient because Dr. Anitra Lauth had a cancellation become available at 1:30PM, so instead of patient being "worked in" on providers schedule, patient has appt time slot.  He agreed and very happy with switch.

## 2020-06-03 NOTE — Telephone Encounter (Signed)
Spoke with patient and advised of available work in time slot, agreed with time. Please add patient on the schedule for in office appt at 10:45 for Thursday.

## 2020-06-04 ENCOUNTER — Other Ambulatory Visit: Payer: Self-pay

## 2020-06-04 ENCOUNTER — Ambulatory Visit: Payer: 59 | Admitting: Family Medicine

## 2020-06-04 ENCOUNTER — Encounter: Payer: Self-pay | Admitting: Family Medicine

## 2020-06-04 VITALS — BP 100/63 | HR 60 | Temp 97.4°F | Resp 16 | Ht 74.75 in | Wt 208.6 lb

## 2020-06-04 DIAGNOSIS — L0202 Furuncle of face: Secondary | ICD-10-CM | POA: Diagnosis not present

## 2020-06-04 MED ORDER — MUPIROCIN 2 % EX OINT: 1.0000 "application " | TOPICAL_OINTMENT | Freq: Three times a day (TID) | CUTANEOUS | 1 refills | Status: DC

## 2020-06-04 NOTE — Progress Notes (Signed)
OFFICE VISIT  06/04/2020  CC:  Chief Complaint  Patient presents with  . Recheck cheek   HPI:    Patient is a 59 y.o. Caucasian male who presents for 2 week recheck L cheek furuncle that grew MRSA. A/P as of last visit: "L cheek furuncle, MRSA. D/c augmentin. Start doxycycline 100mg  bid x 10d, cautioned pt to minimize direct sun exposure while on this antibiotic. Cont bactroban ointment tid and continue warm compresses 20 min 2-3 times a day."  INTERIM HX: Still feels a little nodularity in the region of concern in L cheek, still a little redness present. No pain or itching.  No drainage has come in the last 2 wks.  Energy level back to normal. No fevers.  Finished 10d doxy course and is still applying bactroban ointment.    Past Medical History:  Diagnosis Date  . Adenomatous colon polyp 2011; 03/2015  . Basal cell carcinoma 04/2017   L scapula  . Diverticulosis   . Extensor tenosynovitis of right wrist 06/2017   Dr. Tamala Julian.  GREAT response to steroid injection.  . Hyperlipidemia 2012   TLC helpful initially, but eventually started atorva 03/2019 and had good response.  . Left shoulder pain 02/2016   ? calcific bursitis: Dr. Bing Neighbors NSAIDs.  Subacromial bursitis/impingement syndrome dx'd at f/u with Dr. Lavonna Rua injection given.  No improvement: MRI showed small insertional RC tear and adhesive capsulitis--steroid injection done again, PT and steroid injections helping as of 06/22/16.  . Low back pain   . PAF (paroxysmal atrial fibrillation) ([place]) 02/2020   CHADSVASC score= 0.  Dr. Veverly Fells, sports med cardiology specialist at [facility] dx'd pt with Athelete heart syndrome.  As of 05/2020 pt to see electrophysiologist before getting back with Dr. Veverly Fells to decide what to do for ultimate tx.  . Prediabetes 02/15/2016   HbA1c 6.1%.  A1c 6.3% 03/2017.  TLC-->A1c hanging around 6.1-6.3 as of 06/2019 (on no med).  . Right shoulder pain    subacromial bursitis and AC joint arth:  steroid inj's by Dr. Tamala Julian.  Adhesive capsulitis 09/2018.  Marland Kitchen TFCC (triangular fibrocartilage complex) tear    degenerative, right    Past Surgical History:  Procedure Laterality Date  . BASAL CELL CARCINOMA EXCISION    . COLONOSCOPY W/ POLYPECTOMY  10/2009; 03/2015   2011 had 1 polyp, 2016 had 2 polyps: recall 5 yrs  . LUMBAR DISC SURGERY  03/2010   L4-5  . SHOULDER SURGERY Left 07/2016   Lysis of adhesions (calcific bursitis), bone spur removal, distal clavicle excision.  . TRANSTHORACIC ECHOCARDIOGRAM  03/04/2020   In the setting of new dx a-fib: EF 45-50%, mild dilat LV, mod dilat LA, global LV hypokin-->Dr. Croituru to see    Outpatient Medications Prior to Visit  Medication Sig Dispense Refill  . atorvastatin (LIPITOR) 20 MG tablet Take 1 tablet (20 mg total) by mouth daily. 90 tablet 3  . diltiazem (CARDIZEM) 30 MG tablet Take 1/2-1 tablet every 4 hours AS NEEDED for heart rate >100 as long as blood pressure >100. 45 tablet 1  . Misc Natural Products (TART CHERRY ADVANCED PO) Take by mouth daily.     . Multiple Vitamins-Minerals (MULTIVITAMIN ADULT PO) Take by mouth daily.    . mupirocin ointment (BACTROBAN) 2 % Apply 1 application topically 3 (three) times daily. 15 g 0  . Turmeric 500 MG CAPS Take by mouth daily.     Marland Kitchen doxycycline (VIBRAMYCIN) 100 MG capsule 1 cap po bid x 10d (Patient not  taking: Reported on 06/04/2020) 20 capsule 0   No facility-administered medications prior to visit.    Allergies  Allergen Reactions  . Poison Ivy Extract Itching and Rash    ROS As per HPI  PE: Vitals with BMI 06/04/2020 05/22/2020 05/18/2020  Height 6' 2.75" 6' 2.75" 6' 2.75"  Weight 208 lbs 10 oz 213 lbs 13 oz 209 lbs 3 oz  BMI 26.24 84.69 62.95  Systolic 284 132 440  Diastolic 63 66 69  Pulse 60 50 67     Gen: Alert, well appearing.  Patient is oriented to person, place, time, and situation. AFFECT: pleasant, lucid thought and speech. L cheek overlying mandible there is a 3  cm region of subQ fullness/induration w/out tenderness or fluctuance.  This area is a bit erythematous. +Mobile.  No neck adenopathy.  Parotid normal.  LABS:    Chemistry      Component Value Date/Time   NA 141 03/17/2020 0917   NA 139 02/15/2016 0000   K 4.4 03/17/2020 0917   CL 107 03/17/2020 0917   CO2 28 03/17/2020 0917   BUN 17 03/17/2020 0917   BUN 17 02/15/2016 0000   CREATININE 1.16 03/17/2020 0917   GLU 106 02/15/2016 0000      Component Value Date/Time   CALCIUM 9.1 03/17/2020 0917   ALKPHOS 53 03/17/2020 0917   AST 15 03/17/2020 0917   ALT 13 03/17/2020 0917   BILITOT 0.7 03/17/2020 0917      IMPRESSION AND PLAN:  L cheek furuncle, MRSA+ culture. Still some remnant nodularity and mild erythema. Decided to do I&D today to make sure no abscess remaining. No abscess found when I did this.  The area is likely residual post-inflamm indurated/edematous tissue and we'll cont expectant mgmt, cont bactroban tid.  Recheck 1 wk.  Procedure: Incision and drainage of L cheek abscess.  The indication for the procedure was explained to the patient, benefits and risks of procedure were outlined for patient, patient agreed to proceed.  Steps of the procedure were clearly explained to the patient prior to starting. Injected lesion with 3 ml of 1% lidocaine w/out epi for local anesthesia.  Incised central portion of lesion with scalpel and cut tiny ellipse-shaped portion of tissue out--manual pressure and hemostats used to explore the area and no pus pocket found.   Wound dressed.  Bleeding minimal. Patient tolerated procedure well.  No immediate complications.  Wound care instructions given.  Warning signs of infection discussed. Follow up discussed.  Call or return for problems.  An After Visit Summary was printed and given to the patient.  FOLLOW UP: No follow-ups on file.  Signed:  Crissie Sickles, MD           06/04/2020

## 2020-06-11 ENCOUNTER — Encounter: Payer: Self-pay | Admitting: Family Medicine

## 2020-06-11 ENCOUNTER — Ambulatory Visit: Payer: 59 | Admitting: Family Medicine

## 2020-06-11 ENCOUNTER — Other Ambulatory Visit: Payer: Self-pay

## 2020-06-11 VITALS — BP 96/66 | HR 70 | Temp 97.4°F | Resp 16 | Ht 74.75 in | Wt 208.8 lb

## 2020-06-11 DIAGNOSIS — L0202 Furuncle of face: Secondary | ICD-10-CM

## 2020-06-11 NOTE — Progress Notes (Signed)
OFFICE VISIT  06/11/2020  CC:  Chief Complaint  Patient presents with  . Follow-up    Cheek furuncle    HPI:    Patient is a 60 y.o. Caucasian male who presents for 1 wk f/u L cheek MRSA+ furuncle. Last visit I did I&D.  No pus found.  I recommended he cont to apply bactroban.  No new meds rx'd.  INTERIM HX: Doing well, no pain or itching or drainage from the wound. No f/c/malaise.    Past Medical History:  Diagnosis Date  . Adenomatous colon polyp 2011; 03/2015  . Basal cell carcinoma 04/2017   L scapula  . Diverticulosis   . Extensor tenosynovitis of right wrist 06/2017   Dr. Tamala Julian.  GREAT response to steroid injection.  . Hyperlipidemia 2012   TLC helpful initially, but eventually started atorva 03/2019 and had good response.  . Left shoulder pain 02/2016   ? calcific bursitis: Dr. Bing Neighbors NSAIDs.  Subacromial bursitis/impingement syndrome dx'd at f/u with Dr. Lavonna Rua injection given.  No improvement: MRI showed small insertional RC tear and adhesive capsulitis--steroid injection done again, PT and steroid injections helping as of 06/22/16.  . Low back pain   . PAF (paroxysmal atrial fibrillation) ([place]) 02/2020   CHADSVASC score= 0.  Dr. Veverly Fells, sports med cardiology specialist at [facility] dx'd pt with Athelete heart syndrome.  As of 05/2020 pt to see electrophysiologist before getting back with Dr. Veverly Fells to decide what to do for ultimate tx.  . Prediabetes 02/15/2016   HbA1c 6.1%.  A1c 6.3% 03/2017.  TLC-->A1c hanging around 6.1-6.3 as of 06/2019 (on no med).  . Right shoulder pain    subacromial bursitis and AC joint arth: steroid inj's by Dr. Tamala Julian.  Adhesive capsulitis 09/2018.  Marland Kitchen TFCC (triangular fibrocartilage complex) tear    degenerative, right    Past Surgical History:  Procedure Laterality Date  . BASAL CELL CARCINOMA EXCISION    . COLONOSCOPY W/ POLYPECTOMY  10/2009; 03/2015   2011 had 1 polyp, 2016 had 2 polyps: recall 5 yrs  . LUMBAR DISC SURGERY   03/2010   L4-5  . SHOULDER SURGERY Left 07/2016   Lysis of adhesions (calcific bursitis), bone spur removal, distal clavicle excision.  . TRANSTHORACIC ECHOCARDIOGRAM  03/04/2020   In the setting of new dx a-fib: EF 45-50%, mild dilat LV, mod dilat LA, global LV hypokin-->Dr. Croituru to see    Outpatient Medications Prior to Visit  Medication Sig Dispense Refill  . atorvastatin (LIPITOR) 20 MG tablet Take 1 tablet (20 mg total) by mouth daily. 90 tablet 3  . diltiazem (CARDIZEM) 30 MG tablet Take 1/2-1 tablet every 4 hours AS NEEDED for heart rate >100 as long as blood pressure >100. 45 tablet 1  . Misc Natural Products (TART CHERRY ADVANCED PO) Take by mouth daily.     . Multiple Vitamins-Minerals (MULTIVITAMIN ADULT PO) Take by mouth daily.    . mupirocin ointment (BACTROBAN) 2 % Apply 1 application topically 3 (three) times daily. 15 g 1  . Turmeric 500 MG CAPS Take by mouth daily.      No facility-administered medications prior to visit.    Allergies  Allergen Reactions  . Poison Ivy Extract Itching and Rash    ROS As per HPI  PE: Vitals with BMI 06/11/2020 06/04/2020 05/22/2020  Height 6' 2.75" 6' 2.75" 6' 2.75"  Weight 208 lbs 13 oz 208 lbs 10 oz 213 lbs 13 oz  BMI 26.26 38.25 05.39  Systolic 96 767 341  Diastolic 66 63 66  Pulse 70 60 50     Gen: Alert, well appearing.  Patient is oriented to person, place, time, and situation. AFFECT: pleasant, lucid thought and speech. L cheek without swelling or nodule or tenderness.  His incision is sealed/closed. There is about a 1cm region of mild erythema and focal induration of subQ tissue at the end of the incision closest to his ear.  No fluctuance.  LABS:  Lab Results  Component Value Date   WBC 7.0 03/17/2020   HGB 13.8 03/17/2020   HCT 41.4 03/17/2020   MCV 95.5 03/17/2020   PLT 202.0 03/17/2020    IMPRESSION AND PLAN:  L cheek MRSA furuncle, healing appropriately s/p I&D 1 wk ago. No new meds recommended.   He can let this go uncovered now, ok to not apply any topical meds. Obs. Signs/symptoms to call or return for were reviewed and pt expressed understanding.  An After Visit Summary was printed and given to the patient.  FOLLOW UP: Return if symptoms worsen or fail to improve.  Signed:  Crissie Sickles, MD           06/11/2020

## 2020-06-15 ENCOUNTER — Encounter: Payer: Self-pay | Admitting: Family Medicine

## 2020-06-15 NOTE — Telephone Encounter (Signed)
Pt was last seen on 06/11/20 for follow up. Please advise, thanks.

## 2020-06-19 MED ORDER — DOXYCYCLINE HYCLATE 100 MG PO CAPS: 100.0000 mg | ORAL_CAPSULE | Freq: Two times a day (BID) | ORAL | 0 refills | Status: DC

## 2020-06-19 NOTE — Telephone Encounter (Signed)
FYI. Please see below. Attachment below printed

## 2020-06-19 NOTE — Telephone Encounter (Signed)
OK, I want him to take 10 more days of doxycycline.  I sent rx in just now. Have him send another pic in a week or so with progress report, or he can always make appt to see in person if things are getting worse.

## 2020-06-24 NOTE — Telephone Encounter (Signed)
Follow up in office when finished with oral antibiotic. I recommend he resume warm compress treatments if he has not already been doing this.--thx

## 2020-06-24 NOTE — Telephone Encounter (Signed)
Please advise if you would like pt to make an appt before completing Rx

## 2020-06-29 ENCOUNTER — Other Ambulatory Visit: Payer: Self-pay

## 2020-06-29 ENCOUNTER — Encounter: Payer: Self-pay | Admitting: Family Medicine

## 2020-06-29 ENCOUNTER — Ambulatory Visit: Payer: 59 | Admitting: Family Medicine

## 2020-06-29 VITALS — BP 111/59 | HR 46 | Temp 97.3°F | Resp 16 | Ht 74.75 in | Wt 217.6 lb

## 2020-06-29 DIAGNOSIS — A4902 Methicillin resistant Staphylococcus aureus infection, unspecified site: Secondary | ICD-10-CM

## 2020-06-29 DIAGNOSIS — L0202 Furuncle of face: Secondary | ICD-10-CM | POA: Diagnosis not present

## 2020-06-29 MED ORDER — DOXYCYCLINE HYCLATE 100 MG PO CAPS: 100.0000 mg | ORAL_CAPSULE | Freq: Two times a day (BID) | ORAL | 0 refills | Status: DC

## 2020-06-29 NOTE — Progress Notes (Signed)
OFFICE VISIT  06/29/2020  CC:  Chief Complaint  Patient presents with  . Follow-up    MRSA    HPI:    Patient is a 60 y.o. Caucasian male who presents for f/u MRSA furuncle L cheek. On 06/19/20 pt sent pic and stated he had been noting a little ongoing redness at site on L jawline.  I then started him on doxycycline. Shortly after getting on the doxy the area opened up and started draining some. It stopped draining a couple days ago and he can barely feel a hint of a bump in the area today. No pain.  No f/c/malaise.  He has finished the 10d course of doxy today.  Looking back in notes, this process on left cheek all started approx 7 weeks ago.  Past Medical History:  Diagnosis Date  . Adenomatous colon polyp 2011; 03/2015  . Basal cell carcinoma 04/2017   L scapula  . Diverticulosis   . Extensor tenosynovitis of right wrist 06/2017   Dr. Tamala Julian.  GREAT response to steroid injection.  . Hyperlipidemia 2012   TLC helpful initially, but eventually started atorva 03/2019 and had good response.  . Left shoulder pain 02/2016   ? calcific bursitis: Dr. Bing Neighbors NSAIDs.  Subacromial bursitis/impingement syndrome dx'd at f/u with Dr. Lavonna Rua injection given.  No improvement: MRI showed small insertional RC tear and adhesive capsulitis--steroid injection done again, PT and steroid injections helping as of 06/22/16.  . Low back pain   . PAF (paroxysmal atrial fibrillation) ([place]) 02/2020   CHADSVASC score= 0.  Dr. Veverly Fells, sports med cardiology specialist at [facility] dx'd pt with Athelete heart syndrome.  As of 05/2020 pt to see electrophysiologist before getting back with Dr. Veverly Fells to decide what to do for ultimate tx.  . Prediabetes 02/15/2016   HbA1c 6.1%.  A1c 6.3% 03/2017.  TLC-->A1c hanging around 6.1-6.3 as of 06/2019 (on no med).  . Right shoulder pain    subacromial bursitis and AC joint arth: steroid inj's by Dr. Tamala Julian.  Adhesive capsulitis 09/2018.  Marland Kitchen TFCC (triangular  fibrocartilage complex) tear    degenerative, right    Past Surgical History:  Procedure Laterality Date  . BASAL CELL CARCINOMA EXCISION    . COLONOSCOPY W/ POLYPECTOMY  10/2009; 03/2015   2011 had 1 polyp, 2016 had 2 polyps: recall 5 yrs  . LUMBAR DISC SURGERY  03/2010   L4-5  . SHOULDER SURGERY Left 07/2016   Lysis of adhesions (calcific bursitis), bone spur removal, distal clavicle excision.  . TRANSTHORACIC ECHOCARDIOGRAM  03/04/2020   In the setting of new dx a-fib: EF 45-50%, mild dilat LV, mod dilat LA, global LV hypokin-->Dr. Croituru to see    Outpatient Medications Prior to Visit  Medication Sig Dispense Refill  . atorvastatin (LIPITOR) 20 MG tablet Take 1 tablet (20 mg total) by mouth daily. 90 tablet 3  . Misc Natural Products (TART CHERRY ADVANCED PO) Take by mouth daily.     . Multiple Vitamins-Minerals (MULTIVITAMIN ADULT PO) Take by mouth daily.    . mupirocin ointment (BACTROBAN) 2 % Apply 1 application topically 3 (three) times daily. 15 g 1  . Turmeric 500 MG CAPS Take by mouth daily.     Marland Kitchen diltiazem (CARDIZEM) 30 MG tablet Take 1/2-1 tablet every 4 hours AS NEEDED for heart rate >100 as long as blood pressure >100. (Patient not taking: Reported on 06/29/2020) 45 tablet 1  . doxycycline (VIBRAMYCIN) 100 MG capsule Take 1 capsule (100 mg total) by  mouth 2 (two) times daily for 10 days. (Patient not taking: Reported on 06/29/2020) 20 capsule 0   No facility-administered medications prior to visit.    Allergies  Allergen Reactions  . Poison Ivy Extract Itching and Rash    ROS As per HPI  PE: Vitals with BMI 06/29/2020 06/11/2020 06/04/2020  Height 6' 2.75" 6' 2.75" 6' 2.75"  Weight 217 lbs 10 oz 208 lbs 13 oz 208 lbs 10 oz  BMI 27.37 03.70 96.43  Systolic 838 96 184  Diastolic 59 66 63  Pulse 46 70 60     Gen: Alert, well appearing.  Patient is oriented to person, place, time, and situation. AFFECT: pleasant, lucid thought and speech. L cheek with 2-3 cm  area of pinkish discolored skin with poorly defined borders.  No nodularity but there is a punctate area of induration palpable in subQ tissue where the furuncle used to be.  NO skin opening or breakdown or drainage tract.  No tenderness. No neck adenopathy.  LABS:    Chemistry      Component Value Date/Time   NA 141 03/17/2020 0917   NA 139 02/15/2016 0000   K 4.4 03/17/2020 0917   CL 107 03/17/2020 0917   CO2 28 03/17/2020 0917   BUN 17 03/17/2020 0917   BUN 17 02/15/2016 0000   CREATININE 1.16 03/17/2020 0917   GLU 106 02/15/2016 0000      Component Value Date/Time   CALCIUM 9.1 03/17/2020 0917   ALKPHOS 53 03/17/2020 0917   AST 15 03/17/2020 0917   ALT 13 03/17/2020 0917   BILITOT 0.7 03/17/2020 0917     Wound clx (left side of face) 05/19/20: MRSA (sensitive to clinda, gent, tetracycline, bactrim, and vancomycin).  IMPRESSION AND PLAN:  L cheek/face furuncle, +MRSA on clx 05/19/20. Near complete resolution at this point. This is the point it got to before, then not long afterwards started growing/progressing again when off abx.   We'll be more aggressive this time and treat with 2 more weeks of doxycycline 100mg  bid. Pt was in agreement with this plan. Also since this has been such a prolonged issue for him I recommended attempts at decolonization---chlorhexidine 2% or 4% rinse qd x 7d and bactroban ointment in each nare bid x 7d.  An After Visit Summary was printed and given to the patient.  FOLLOW UP: Return if symptoms worsen or fail to improve.  Signed:  Crissie Sickles, MD           06/29/2020

## 2020-06-29 NOTE — Patient Instructions (Signed)
Apply bactroban ointment to inside of your nose twice daily for 7d  Wash your face and body with Hibiclens (chlorhexidine) 2% or 4% daily for 7 days.

## 2020-07-09 HISTORY — PX: OTHER SURGICAL HISTORY: SHX169

## 2020-07-13 ENCOUNTER — Encounter: Payer: Self-pay | Admitting: Family Medicine

## 2020-07-13 ENCOUNTER — Other Ambulatory Visit: Payer: Self-pay

## 2020-07-13 ENCOUNTER — Ambulatory Visit: Payer: 59 | Admitting: Family Medicine

## 2020-07-13 VITALS — BP 113/65 | HR 46 | Temp 97.3°F | Resp 16 | Ht 74.75 in | Wt 212.8 lb

## 2020-07-13 DIAGNOSIS — L0202 Furuncle of face: Secondary | ICD-10-CM

## 2020-07-13 DIAGNOSIS — D1801 Hemangioma of skin and subcutaneous tissue: Secondary | ICD-10-CM | POA: Insufficient documentation

## 2020-07-13 DIAGNOSIS — Z85828 Personal history of other malignant neoplasm of skin: Secondary | ICD-10-CM | POA: Insufficient documentation

## 2020-07-13 DIAGNOSIS — C44519 Basal cell carcinoma of skin of other part of trunk: Secondary | ICD-10-CM | POA: Insufficient documentation

## 2020-07-13 DIAGNOSIS — L814 Other melanin hyperpigmentation: Secondary | ICD-10-CM | POA: Insufficient documentation

## 2020-07-13 NOTE — Progress Notes (Signed)
OFFICE VISIT  07/13/2020  CC:  Chief Complaint  Patient presents with  . Follow-up    MRSA furuncle    HPI:    Patient is a 60 y.o. Caucasian male who presents for 2 wk f/u L cheek MRSA furuncle. A/P as of last visit: "L cheek/face furuncle, +MRSA on clx 05/19/20. Near complete resolution at this point. This is the point it got to before, then not long afterwards started growing/progressing again when off abx.   We'll be more aggressive this time and treat with 2 more weeks of doxycycline 100mg  bid. Pt was in agreement with this plan. Also since this has been such a prolonged issue for him I recommended attempts at decolonization---chlorhexidine 2% or 4% rinse qd x 7d and bactroban ointment in each nare bid x 7d."  INTERIM HX: Doing well, says no pain at all, although says the area was a little red and swollen up until 3 d/a.  No drainage. He finished his doxycycline today.   Past Medical History:  Diagnosis Date  . Adenomatous colon polyp 2011; 03/2015  . Basal cell carcinoma 04/2017   L scapula  . Diverticulosis   . Extensor tenosynovitis of right wrist 06/2017   Dr. Tamala Julian.  GREAT response to steroid injection.  . Hyperlipidemia 2012   TLC helpful initially, but eventually started atorva 03/2019 and had good response.  . Left shoulder pain 02/2016   ? calcific bursitis: Dr. Bing Neighbors NSAIDs.  Subacromial bursitis/impingement syndrome dx'd at f/u with Dr. Lavonna Rua injection given.  No improvement: MRI showed small insertional RC tear and adhesive capsulitis--steroid injection done again, PT and steroid injections helping as of 06/22/16.  . Low back pain   . PAF (paroxysmal atrial fibrillation) ([place]) 02/2020   CHADSVASC score= 0.  Dr. Veverly Fells, sports med cardiology specialist at [facility] dx'd pt with Athelete heart syndrome.  As of 05/2020 pt to see electrophysiologist before getting back with Dr. Veverly Fells to decide what to do for ultimate tx.  . Prediabetes 02/15/2016    HbA1c 6.1%.  A1c 6.3% 03/2017.  TLC-->A1c hanging around 6.1-6.3 as of 06/2019 (on no med).  . Right shoulder pain    subacromial bursitis and AC joint arth: steroid inj's by Dr. Tamala Julian.  Adhesive capsulitis 09/2018.  Marland Kitchen TFCC (triangular fibrocartilage complex) tear    degenerative, right    Past Surgical History:  Procedure Laterality Date  . BASAL CELL CARCINOMA EXCISION    . CARDIAC MRI  07/09/2020   ALL NORMAL excep small focal region of scarring in basal inferolateral wall, pattern nonspecific.  Marland Kitchen COLONOSCOPY W/ POLYPECTOMY  10/2009; 03/2015   2011 had 1 polyp, 2016 had 2 polyps: recall 5 yrs  . LUMBAR DISC SURGERY  03/2010   L4-5  . SHOULDER SURGERY Left 07/2016   Lysis of adhesions (calcific bursitis), bone spur removal, distal clavicle excision.  . TRANSTHORACIC ECHOCARDIOGRAM  03/04/2020   In the setting of new dx a-fib: EF 45-50%, mild dilat LV, mod dilat LA, global LV hypokin-->Dr. Croituru to see    Outpatient Medications Prior to Visit  Medication Sig Dispense Refill  . atorvastatin (LIPITOR) 20 MG tablet Take 1 tablet (20 mg total) by mouth daily. 90 tablet 3  . Misc Natural Products (TART CHERRY ADVANCED PO) Take by mouth daily.     . Multiple Vitamins-Minerals (MULTIVITAMIN ADULT PO) Take by mouth daily.    . mupirocin ointment (BACTROBAN) 2 % Apply 1 application topically 3 (three) times daily. 15 g 1  . Turmeric  500 MG CAPS Take by mouth daily.     Marland Kitchen diltiazem (CARDIZEM) 30 MG tablet Take 1/2-1 tablet every 4 hours AS NEEDED for heart rate >100 as long as blood pressure >100. (Patient not taking: No sig reported) 45 tablet 1  . doxycycline (VIBRAMYCIN) 100 MG capsule Take 1 capsule (100 mg total) by mouth 2 (two) times daily for 14 days. (Patient not taking: Reported on 07/13/2020) 28 capsule 0   No facility-administered medications prior to visit.    Allergies  Allergen Reactions  . Poison Ivy Extract Itching and Rash    ROS As per HPI  PE: Vitals with BMI  07/13/2020 06/29/2020 06/11/2020  Height 6' 2.75" 6' 2.75" 6' 2.75"  Weight 212 lbs 13 oz 217 lbs 10 oz 208 lbs 13 oz  BMI 26.77 16.10 96.04  Systolic 540 981 96  Diastolic 65 59 66  Pulse 46 46 70     Gen: Alert, well appearing.  Patient is oriented to person, place, time, and situation. AFFECT: pleasant, lucid thought and speech. L jaw with approx nickel-sized slotch of mild pinish-hued skin that is nontender and not swollen.  I feel no nodularity or fluctuance.  LABS:    Chemistry      Component Value Date/Time   NA 141 03/17/2020 0917   NA 139 02/15/2016 0000   K 4.4 03/17/2020 0917   CL 107 03/17/2020 0917   CO2 28 03/17/2020 0917   BUN 17 03/17/2020 0917   BUN 17 02/15/2016 0000   CREATININE 1.16 03/17/2020 0917   GLU 106 02/15/2016 0000      Component Value Date/Time   CALCIUM 9.1 03/17/2020 0917   ALKPHOS 53 03/17/2020 0917   AST 15 03/17/2020 0917   ALT 13 03/17/2020 0917   BILITOT 0.7 03/17/2020 0917       IMPRESSION AND PLAN:  L cheek MRSA furuncle, appears healed now s/p 2 more weeks of doxy. Overlying skin discoloration suspected to be post-inflamm pigmentary changes, obs for now.  An After Visit Summary was printed and given to the patient.  FOLLOW UP: No follow-ups on file.  Signed:  Crissie Sickles, MD           07/13/2020

## 2020-07-31 ENCOUNTER — Other Ambulatory Visit: Payer: Self-pay | Admitting: Family Medicine

## 2020-10-21 ENCOUNTER — Encounter: Payer: Self-pay | Admitting: Family Medicine

## 2020-10-30 ENCOUNTER — Other Ambulatory Visit: Payer: Self-pay | Admitting: Family Medicine

## 2020-11-30 ENCOUNTER — Encounter: Payer: Self-pay | Admitting: Family Medicine

## 2020-12-01 ENCOUNTER — Other Ambulatory Visit: Payer: Self-pay | Admitting: Family Medicine

## 2020-12-04 ENCOUNTER — Encounter: Payer: Self-pay | Admitting: Family Medicine

## 2020-12-16 ENCOUNTER — Encounter: Payer: Self-pay | Admitting: Family Medicine

## 2020-12-17 ENCOUNTER — Other Ambulatory Visit: Payer: Self-pay

## 2020-12-17 MED ORDER — ATORVASTATIN CALCIUM 20 MG PO TABS: 20.0000 mg | ORAL_TABLET | Freq: Every day | ORAL | 0 refills | Status: DC

## 2021-03-04 LAB — POCT INR: INR: 1.2 — AB (ref 0.9–1.1)

## 2021-03-09 HISTORY — PX: OTHER SURGICAL HISTORY: SHX169

## 2021-03-09 HISTORY — PX: ELECTROPHYSIOLOGIC STUDY: SHX172A

## 2021-03-10 DIAGNOSIS — I480 Paroxysmal atrial fibrillation: Secondary | ICD-10-CM | POA: Insufficient documentation

## 2021-03-10 LAB — CBC AND DIFFERENTIAL
HCT: 38 — AB (ref 41–53)
Hemoglobin: 12.9 — AB (ref 13.5–17.5)
Platelets: 213 (ref 150–399)
WBC: 8.3

## 2021-03-10 LAB — BASIC METABOLIC PANEL
BUN: 12 (ref 4–21)
CO2: 27 — AB (ref 13–22)
Chloride: 106 (ref 99–108)
Creatinine: 1.1 (ref 0.6–1.3)
Glucose: 126
Potassium: 3.9 (ref 3.4–5.3)
Sodium: 136 — AB (ref 137–147)

## 2021-03-10 LAB — COMPREHENSIVE METABOLIC PANEL: Calcium: 8.8 (ref 8.7–10.7)

## 2021-03-13 ENCOUNTER — Other Ambulatory Visit: Payer: Self-pay | Admitting: Family Medicine

## 2021-03-16 ENCOUNTER — Other Ambulatory Visit: Payer: Self-pay

## 2021-03-17 ENCOUNTER — Ambulatory Visit (INDEPENDENT_AMBULATORY_CARE_PROVIDER_SITE_OTHER): Payer: 59 | Admitting: Family Medicine

## 2021-03-17 ENCOUNTER — Encounter: Payer: Self-pay | Admitting: Family Medicine

## 2021-03-17 ENCOUNTER — Other Ambulatory Visit: Payer: Self-pay

## 2021-03-17 VITALS — BP 110/69 | HR 63 | Temp 97.9°F | Ht 74.75 in | Wt 218.2 lb

## 2021-03-17 DIAGNOSIS — Z0000 Encounter for general adult medical examination without abnormal findings: Secondary | ICD-10-CM

## 2021-03-17 DIAGNOSIS — E7800 Pure hypercholesterolemia, unspecified: Secondary | ICD-10-CM

## 2021-03-17 DIAGNOSIS — Z125 Encounter for screening for malignant neoplasm of prostate: Secondary | ICD-10-CM

## 2021-03-17 DIAGNOSIS — R7303 Prediabetes: Secondary | ICD-10-CM

## 2021-03-17 DIAGNOSIS — I480 Paroxysmal atrial fibrillation: Secondary | ICD-10-CM

## 2021-03-17 LAB — COMPREHENSIVE METABOLIC PANEL
ALT: 20 U/L (ref 0–53)
AST: 14 U/L (ref 0–37)
Albumin: 4.2 g/dL (ref 3.5–5.2)
Alkaline Phosphatase: 67 U/L (ref 39–117)
BUN: 18 mg/dL (ref 6–23)
CO2: 29 mEq/L (ref 19–32)
Calcium: 9.5 mg/dL (ref 8.4–10.5)
Chloride: 101 mEq/L (ref 96–112)
Creatinine, Ser: 1.18 mg/dL (ref 0.40–1.50)
GFR: 66.43 mL/min (ref >60.00)
Glucose, Bld: 103 mg/dL — ABNORMAL HIGH (ref 70–99)
Potassium: 4.7 mEq/L (ref 3.5–5.1)
Sodium: 138 mEq/L (ref 135–145)
Total Bilirubin: 0.5 mg/dL (ref 0.2–1.2)
Total Protein: 7.4 g/dL (ref 6.0–8.3)

## 2021-03-17 LAB — LIPID PANEL
Cholesterol: 146 mg/dL (ref 0–200)
HDL: 49.2 mg/dL (ref >39.00)
LDL Cholesterol: 80 mg/dL (ref 0–99)
NonHDL: 97.22
Total CHOL/HDL Ratio: 3
Triglycerides: 85 mg/dL (ref 0.0–149.0)
VLDL: 17 mg/dL (ref 0.0–40.0)

## 2021-03-17 LAB — CBC WITH DIFFERENTIAL/PLATELET
Basophils Absolute: 0 10*3/uL (ref 0.0–0.1)
Basophils Relative: 0.7 % (ref 0.0–3.0)
Eosinophils Absolute: 0.1 10*3/uL (ref 0.0–0.7)
Eosinophils Relative: 2 % (ref 0.0–5.0)
HCT: 43.5 % (ref 39.0–52.0)
Hemoglobin: 14.2 g/dL (ref 13.0–17.0)
Lymphocytes Relative: 29.3 % (ref 12.0–46.0)
Lymphs Abs: 1.6 10*3/uL (ref 0.7–4.0)
MCHC: 32.8 g/dL (ref 30.0–36.0)
MCV: 96.1 fl (ref 78.0–100.0)
Monocytes Absolute: 0.4 10*3/uL (ref 0.1–1.0)
Monocytes Relative: 7.6 % (ref 3.0–12.0)
Neutro Abs: 3.3 10*3/uL (ref 1.4–7.7)
Neutrophils Relative %: 60.4 % (ref 43.0–77.0)
Platelets: 221 10*3/uL (ref 150.0–400.0)
RBC: 4.52 Mil/uL (ref 4.22–5.81)
RDW: 14.1 % (ref 11.5–15.5)
WBC: 5.5 10*3/uL (ref 4.0–10.5)

## 2021-03-17 LAB — PSA: PSA: 1.09 ng/mL (ref 0.10–4.00)

## 2021-03-17 LAB — TSH: TSH: 2.08 u[IU]/mL (ref 0.35–5.50)

## 2021-03-17 LAB — HEMOGLOBIN A1C: Hgb A1c MFr Bld: 6.6 % — ABNORMAL HIGH (ref 4.6–6.5)

## 2021-03-17 NOTE — Patient Instructions (Signed)

## 2021-03-17 NOTE — Progress Notes (Signed)
Office Note 03/17/2021  CC:  Chief Complaint  Patient presents with   Annual Exam    Pt is fasting   HPI:  Patient is a 60 y.o. male who is here for annual health maintenance exam and f/u HLD, prediabetes, and afib/flutter.  He is s/p afib/flutter ablation 03/09/21 with [facility]. CBC and bmet done at that time-->WBC 6.6, Hb 14.8, Plt 215K. Na 138, K 4.5, Chl 105, CO2 27, BUN 21, sCr 1.3.  Thomas Hubbard feels great.  No longer feeling any sensation of his heart beating irregular or fast since having his ablation last week. He will gradually start getting back into the swing of exercise. Has not been focusing any on diet through all of this.    Past Medical History:  Diagnosis Date   Adenomatous colon polyp 2011; 03/2015   2011, 2016, and 11/2020->recall 30yrs   Basal cell carcinoma 04/2017   L scapula   Diverticulosis    Extensor tenosynovitis of right wrist 06/2017   Dr. Tamala Julian.  GREAT response to steroid injection.   Hyperlipidemia 2012   TLC helpful initially, but eventually started atorva 03/2019 and had good response.   Left shoulder pain 02/2016   ? calcific bursitis: Dr. Bing Neighbors NSAIDs.  Subacromial bursitis/impingement syndrome dx'd at f/u with Dr. Lavonna Rua injection given.  No improvement: MRI showed small insertional RC tear and adhesive capsulitis--steroid injection done again, PT and steroid injections helping as of 06/22/16.   Low back pain    PAF (paroxysmal atrial fibrillation) ([place]) 02/2020   CHADSVASC score= 0.  Dr. Veverly Fells, sports med cardiology specialist at [facility] dx'd pt with Athelete heart syndrome.  Feb 2022 EP MD eval, eventual final dx a-fib and flutter (>a-fib burden than flutter), ablation being considered as of 10/2020.   Prediabetes 02/15/2016   HbA1c 6.1%.  A1c 6.3% 03/2017.  TLC-->A1c hanging around 6.1-6.3 as of 06/2019 (on no med).   Right shoulder pain    subacromial bursitis and AC joint arth: steroid inj's by Dr. Tamala Julian.  Adhesive capsulitis  09/2018.   TFCC (triangular fibrocartilage complex) tear    degenerative, right    Past Surgical History:  Procedure Laterality Date   BASAL CELL CARCINOMA EXCISION     CARDIAC MRI  07/09/2020   ALL NORMAL excep small focal region of scarring in basal inferolateral wall, pattern nonspecific.   COLONOSCOPY W/ POLYPECTOMY  10/2009; 03/2015   2011 had 1 polyp, 2016 had 2 polyps. 11/2020 3 polyps: recall 5 yrs   ELECTROPHYSIOLOGIC STUDY  03/09/2021   [facility]   EP ablasion  03/09/2021   Daubert, Sherrie George, MD   LUMBAR DISC SURGERY  03/2010   L4-5   SHOULDER SURGERY Left 07/2016   Lysis of adhesions (calcific bursitis), bone spur removal, distal clavicle excision.   TRANSTHORACIC ECHOCARDIOGRAM  03/04/2020   In the setting of new dx a-fib: EF 45-50%, mild dilat LV, mod dilat LA, global LV hypokin-->Dr. Croituru to see    Family History  Problem Relation Age of Onset   Cancer Mother 21       Colon   Hyperlipidemia Father     Social History   Socioeconomic History   Marital status: Married    Spouse name: Not on file   Number of children: 3   Years of education: Not on file   Highest education level: Not on file  Occupational History   Occupation: Youth worker    Employer: [employer]  Tobacco Use   Smoking status: Never  Smokeless tobacco: Never  Vaping Use   Vaping Use: Never used  Substance and Sexual Activity   Alcohol use: Yes    Alcohol/week: 2.0 standard drinks    Types: 2 Glasses of wine per week   Drug use: No   Sexual activity: Not on file  Other Topics Concern   Not on file  Social History Narrative   Married, 3 grown children.   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  In 2017 he switched to a [employer] called [employer] based out of [place], [place].   No T/A/Ds.      Competetive swimmer for many years, still swims as his primary form of exercise.      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      pantoprazole (PROTONIX) 20 MG tablet Take 20 mg by mouth 2 (two) times daily.     Turmeric 500 MG CAPS Take by mouth daily.      CLENPIQ 10-3.5-12 MG-GM -GM/160ML SOLN See admin instructions. (Patient not taking: Reported on 03/17/2021)     diltiazem (CARDIZEM) 30 MG tablet Take 1/2-1 tablet every 4 hours AS NEEDED for heart rate >100 as long as blood pressure >100. (Patient not taking: Reported on 06/29/2020) 45 tablet 1   Multiple Vitamins-Minerals (MULTIVITAMIN ADULT PO) Take by mouth daily. (Patient not taking: Reported on 03/17/2021)     mupirocin ointment (BACTROBAN) 2 % Apply 1 application topically 3 (three) times daily. (Patient not taking: Reported on 03/17/2021) 15 g 1   No facility-administered medications prior to visit.    Allergies  Allergen Reactions   Poison Ivy Extract Itching and Rash    ROS Review of Systems  PE; Vitals with BMI 03/17/2021 07/13/2020 06/29/2020  Height 6' 2.75" 6' 2.75" 6' 2.75"  Weight 218 lbs 3 oz 212 lbs 13 oz 217 lbs 10 oz  BMI 27.45 38.45 36.46  Systolic 803 212 248  Diastolic 69 65 59  Pulse 63 46 46    Gen: Alert, well appearing.  Patient is oriented to person, place, time, and situation. AFFECT: pleasant, lucid thought and speech. ENT: Ears: EACs clear, normal epithelium.  TMs with good light reflex and landmarks bilaterally.  Eyes: no injection, icteris, swelling, or exudate.   EOMI, PERRLA. Nose: no drainage or turbinate edema/swelling.  No injection or focal lesion.  Mouth: lips without lesion/swelling.  Oral mucosa pink and moist.  Dentition intact and without obvious caries or gingival swelling.  Oropharynx without erythema, exudate, or swelling.  Neck: supple/nontender.  No LAD, mass, or TM.  Carotid pulses 2+ bilaterally, without bruits. CV: RRR, no m/r/g.   LUNGS: CTA bilat, nonlabored resps, good aeration in all lung fields. ABD: soft, NT, ND, BS normal.  No hepatospenomegaly or mass.  No bruits. EXT: no clubbing, cyanosis, or edema.  Musculoskeletal: no joint swelling, erythema, warmth, or tenderness.  ROM of all joints intact. Skin - no sores or suspicious lesions or rashes or color changes  Pertinent labs:  Lab Results  Component Value Date   TSH 1.70 03/17/2020   Lab Results  Component Value Date   WBC 7.0 03/17/2020   HGB 13.8 03/17/2020   HCT 41.4 03/17/2020   MCV 95.5 03/17/2020   PLT 202.0 03/17/2020   Lab Results  Component Value Date   CREATININE 1.16 03/17/2020   BUN 17 03/17/2020   NA 141 03/17/2020   K 4.4 03/17/2020   CL 107 03/17/2020   CO2 28 03/17/2020   Lab Results  Component Value Date   ALT 13 03/17/2020   AST 15 03/17/2020   ALKPHOS 53 03/17/2020   BILITOT 0.7 03/17/2020   Lab Results  Component Value Date   CHOL 135 03/17/2020   Lab Results  Component Value Date   HDL 50.90 03/17/2020   Lab Results  Component Value Date   LDLCALC 70 03/17/2020   Lab Results  Component Value Date   TRIG 69.0 03/17/2020   Lab Results  Component Value Date   CHOLHDL 3 03/17/2020   Lab Results  Component Value Date   PSA 0.89 03/17/2020   PSA 1.13 03/15/2019   PSA 0.93 03/13/2018   Lab Results  Component Value Date   HGBA1C 6.4 03/17/2020   ASSESSMENT AND PLAN:   1) HLD: Was on statin at one point.  Last LDL was 70 one year ago. Lipids and hepatic panel today.  2) Prediabetes: Last A1c 1 year ago was  6.4%. Hba1c and fasting glucose today.  He is on board for starting metformin if A1c is not any better.  3) PAF/flutter.  Ablation was done 03/09/21--feels great..  Remains on eliquis for at least the next 3 months.. Lytes/cr/tsh,cbc.  4) Health maintenance exam: Reviewed age and gender appropriate health maintenance issues (prudent diet, regular exercise, health risks of tobacco and excessive alcohol, use of seatbelts, fire alarms in home, use of sunscreen).  Also reviewed age and gender appropriate health screening as well as vaccine recommendations. Vaccines: Tdap->UTD 2017 in [place].  Prevnar 20->he'll defer for now.  He is up-to-date on all COVID boosters and flu shot. Labs: fasting HP, Hba1c, PSA. Prostate ca screening: PSA today Colon ca screening: recall 11/2025.  An After Visit Summary was printed and given to the patient.  FOLLOW UP:  Return in about 6 months (around 09/15/2021).  Signed:  Crissie Sickles, MD           03/17/2021

## 2021-03-18 ENCOUNTER — Telehealth: Payer: Self-pay

## 2021-03-18 MED ORDER — METFORMIN HCL 500 MG PO TABS: 500.0000 mg | ORAL_TABLET | Freq: Every day | ORAL | 2 refills | Status: DC

## 2021-03-18 NOTE — Telephone Encounter (Signed)
-----   Message from Tammi Sou, MD sent at 03/18/2021  8:06 AM EST ----- All labs normal except hemoglobin A1c 6.6%.  Please prescribe metformin 500 mg, 1 tab p.o. daily with supper, #30, refill x2.  Follow-up in office 3 months --does not have to be fasting.

## 2021-03-30 ENCOUNTER — Encounter: Payer: Self-pay | Admitting: Family Medicine

## 2021-06-05 ENCOUNTER — Other Ambulatory Visit: Payer: Self-pay | Admitting: Family Medicine

## 2021-06-11 ENCOUNTER — Other Ambulatory Visit: Payer: Self-pay | Admitting: Family Medicine

## 2021-06-13 ENCOUNTER — Other Ambulatory Visit: Payer: Self-pay | Admitting: Family Medicine

## 2021-06-14 NOTE — Telephone Encounter (Signed)
Pt has upcoming appt 3/14 ?

## 2021-06-15 ENCOUNTER — Ambulatory Visit: Payer: 59 | Admitting: Family Medicine

## 2021-06-15 ENCOUNTER — Other Ambulatory Visit: Payer: Self-pay

## 2021-06-15 ENCOUNTER — Encounter: Payer: Self-pay | Admitting: Family Medicine

## 2021-06-15 VITALS — BP 100/65 | HR 63 | Temp 97.6°F | Ht 74.75 in | Wt 216.6 lb

## 2021-06-15 DIAGNOSIS — R7303 Prediabetes: Secondary | ICD-10-CM | POA: Diagnosis not present

## 2021-06-15 LAB — POCT GLYCOSYLATED HEMOGLOBIN (HGB A1C)
HbA1c POC (<> result, manual entry): 6.2 % (ref 4.0–5.6)
HbA1c, POC (controlled diabetic range): 6.2 % (ref 0.0–7.0)
HbA1c, POC (prediabetic range): 6.2 % (ref 5.7–6.4)
Hemoglobin A1C: 6.2 % — AB (ref 4.0–5.6)

## 2021-06-15 MED ORDER — ATORVASTATIN CALCIUM 20 MG PO TABS: 20.0000 mg | ORAL_TABLET | Freq: Every day | ORAL | 3 refills | Status: DC

## 2021-06-15 MED ORDER — METFORMIN HCL 500 MG PO TABS
500.0000 mg | ORAL_TABLET | Freq: Every day | ORAL | 3 refills | Status: DC
Start: 2021-06-15 — End: 2021-11-08

## 2021-06-15 NOTE — Progress Notes (Signed)
OFFICE VISIT ? ?06/15/2021 ? ?CC:  ?Chief Complaint  ?Patient presents with  ? Hyperlipidemia  ?  Pt is fasting  ? Prediabetes  ? ? ?HPI:   ? ?Patient is a 61 y.o. male who presents for 3 mo f/u prediabetes, and afib/flutter. ?A/P as of last visit: ?"1) HLD: Was on statin at one point.  Last LDL was 70 one year ago. ?Lipids and hepatic panel today. ?  ?2) Prediabetes: Last A1c 1 year ago was 6.4%. ?Hba1c and fasting glucose today.  He is on board for starting metformin if A1c is not any better. ?  ?3) PAF/flutter.  Ablation was done 03/09/21--feels great..  Remains on eliquis for at least the next 3 months.Marland Kitchen ?Lytes/cr/tsh,cbc. ?  ?4) Health maintenance exam: ?Reviewed age and gender appropriate health maintenance issues (prudent diet, regular exercise, health risks of tobacco and excessive alcohol, use of seatbelts, fire alarms in home, use of sunscreen).  Also reviewed age and gender appropriate health screening as well as vaccine recommendations. ?Vaccines: Tdap->UTD 2017 in [place].  Prevnar 20->he'll defer for now.  He is up-to-date on all COVID boosters and flu shot. ?Labs: fasting HP, Hba1c, PSA. ?Prostate ca screening: PSA today ?Colon ca screening: recall 11/2025" ? ?INTERIM HX: ?All labs normal last visit except hemoglobin A1c up to 6.6%. ?Metformin 500 mg a day was started. ?Feeling well, no side effects from metformin. ? ?He had electrophysiology follow-up a week ago, echo is ordered and is planned for July this year. ?If EF is up to 55-50% then he will be able to d/c eliquis. ?He is swimming 3 days a week and about to restart running a few days a week. ? ?No new concerns. ?Past Medical History:  ?Diagnosis Date  ? Adenomatous colon polyp 2011; 03/2015  ? 2011, 2016, and 11/2020->recall 51yr  ? Basal cell carcinoma 04/2017  ? L scapula  ? Cholelithiasis without cholecystitis   ? incidentally noted on 2022 pre-ablation cardiac/thoracic MRA  ? Diverticulosis   ? Extensor tenosynovitis of right wrist 06/2017  ?  Dr. STamala Julian  GREAT response to steroid injection.  ? Hyperlipidemia 2012  ? TLC helpful initially, but eventually started atorva 03/2019 and had good response.  ? Left shoulder pain 02/2016  ? ? calcific bursitis: Dr. HBing NeighborsNSAIDs.  Subacromial bursitis/impingement syndrome dx'd at f/u with Dr. HLavonna Ruainjection given.  No improvement: MRI showed small insertional RC tear and adhesive capsulitis--steroid injection done again, PT and steroid injections helping as of 06/22/16.  ? Low back pain   ? PAF (paroxysmal atrial fibrillation) ([place] 02/[place]  ? CHADSVASC score= 0.  [facility].  Feb 2022 EP MD eval, eventual final dx a-fib and flutter (>a-fib burden than flutter), ablation being considered as of 10/2020.  ? Prediabetes 02/15/2016  ? HbA1c 6.1%.  A1c 6.3% 03/2017.  TLC-->A1c hanging around 6.1-6.3 as of 06/2019 (on no med).  ? Right shoulder pain   ? subacromial bursitis and AC joint arth: steroid inj's by Dr. STamala Julian  Adhesive capsulitis 09/2018.  ? TFCC (triangular fibrocartilage complex) tear   ? degenerative, right  ? ? ?Past Surgical History:  ?Procedure Laterality Date  ? BASAL CELL CARCINOMA EXCISION    ? CARDIAC MRI  07/09/2020  ? ALL NORMAL excep small focal region of scarring in basal inferolateral wall, pattern nonspecific.  ? COLONOSCOPY W/ POLYPECTOMY  10/2009; 03/2015  ? 2011 had 1 polyp, 2016 had 2 polyps. 11/2020  3 polyps: recall 5 yrs  ? ELECTROPHYSIOLOGIC STUDY  03/09/2021  ? [facility]  ? EP ablasion  03/09/2021  ? Daubert, Sherrie George, MD  ? [place] SURGERY  03/2010  ? L4-5  ? SHOULDER SURGERY Left 07/2016  ? Lysis of adhesions (calcific bursitis), bone spur removal, distal clavicle excision.  ? TRANSTHORACIC ECHOCARDIOGRAM  03/04/2020  ? In the setting of new dx a-fib: EF 45-50%, mild dilat LV, mod dilat LA, global LV hypokin-->Dr. Croituru to see  ? ? ?Outpatient Medications Prior to Visit  ?Medication Sig  Dispense Refill  ? ELIQUIS 5 MG TABS tablet PLEASE SEE ATTACHED FOR DETAILED DIRECTIONS    ? Misc Natural Products (TART CHERRY ADVANCED PO) Take by mouth daily.     ? Turmeric 500 MG CAPS Take by mouth daily.     ? atorvastatin (LIPITOR) 20 MG tablet TAKE 1 TABLET BY MOUTH EVERY DAY 90 tablet 0  ? metFORMIN (GLUCOPHAGE) 500 MG tablet Take 1 tablet (500 mg total) by mouth daily with supper. 30 tablet 2  ? pantoprazole (PROTONIX) 20 MG tablet Take 20 mg by mouth 2 (two) times daily.    ? ?No facility-administered medications prior to visit.  ? ? ?Allergies  ?Allergen Reactions  ? Poison Ivy Extract Itching and Rash  ? ? ?ROS ?As per HPI ? ?PE: ?Vitals with BMI 06/15/2021 03/17/2021 07/13/2020  ?Height 6' 2.75" 6' 2.75" 6' 2.75"  ?Weight 216 lbs 10 oz 218 lbs 3 oz 212 lbs 13 oz  ?BMI 27.24 27.45 26.77  ?Systolic 599 357 017  ?Diastolic 65 69 65  ?Pulse 63 63 46  ? ?Physical Exam ? ?Gen: Alert, well appearing.  Patient is oriented to person, place, time, and situation. ?AFFECT: pleasant, lucid thought and speech. ?CV: RRR, no m/r/g.   ?LUNGS: CTA bilat, nonlabored resps, good aeration in all lung fields. ?EXT: no clubbing or cyanosis.  no edema.  ? ? ?LABS:  ?Last CBC ?Lab Results  ?Component Value Date  ? WBC 5.5 03/17/2021  ? HGB 14.2 03/17/2021  ? HCT 43.5 03/17/2021  ? MCV 96.1 03/17/2021  ? RDW 14.1 03/17/2021  ? PLT 221.0 03/17/2021  ? ?Last metabolic panel ?Lab Results  ?Component Value Date  ? GLUCOSE 103 (H) 03/17/2021  ? NA 138 03/17/2021  ? K 4.7 03/17/2021  ? CL 101 03/17/2021  ? CO2 29 03/17/2021  ? BUN 18 03/17/2021  ? CREATININE 1.18 03/17/2021  ? CALCIUM 9.5 03/17/2021  ? PROT 7.4 03/17/2021  ? ALBUMIN 4.2 03/17/2021  ? BILITOT 0.5 03/17/2021  ? ALKPHOS 67 03/17/2021  ? AST 14 03/17/2021  ? ALT 20 03/17/2021  ? ?Last lipids ?Lab Results  ?Component Value Date  ? CHOL 146 03/17/2021  ? HDL 49.20 03/17/2021  ? [place] 80 03/17/2021  ? TRIG 85.0 03/17/2021  ? CHOLHDL 3 03/17/2021  ? ?Last hemoglobin A1c ?Lab  Results  ?Component Value Date  ? HGBA1C 6.2 (A) 06/15/2021  ? HGBA1C 6.2 06/15/2021  ? HGBA1C 6.2 06/15/2021  ? HGBA1C 6.2 06/15/2021  ? ?Last thyroid functions ?Lab Results  ?Component Value Date  ? TSH 2.08 03/17/2021  ? ?IMPRESSION AND PLAN: ? ?#1 prediabetes. ?Point-of-care A1c 6.2% today. ?Given the margin of error for the point-of-care test I think he likely has had no significant change in A1c compared to 3 months ago.  ?We will hold steady at metformin 500 mg a day.  If not decreasing at the time of next A1c--which will be done by venous  sample--then will increase metformin to 500 twice daily. ? ?#2 PAF/flutter. ?Doing well status post ablation. ?Cardiologist is continuing Eliquis at least until a repeat echo was done 10/2021, EF goal 55 to 60% before D/C Eliquis. ? ?An After Visit Summary was printed and given to the patient. ? ?FOLLOW UP: Return in about 3 months (around 09/15/2021) for f/u prediabetes. ?Cpe 9 mo ? ?Signed:  Crissie Sickles, MD           06/15/2021 ? ?

## 2021-09-15 ENCOUNTER — Ambulatory Visit: Payer: 59 | Admitting: Family Medicine

## 2021-09-20 ENCOUNTER — Encounter: Payer: Self-pay | Admitting: Family Medicine

## 2021-09-20 ENCOUNTER — Ambulatory Visit: Payer: 59 | Admitting: Family Medicine

## 2021-09-20 VITALS — BP 104/61 | HR 49 | Temp 97.8°F | Ht 74.75 in | Wt 224.0 lb

## 2021-09-20 DIAGNOSIS — E7800 Pure hypercholesterolemia, unspecified: Secondary | ICD-10-CM

## 2021-09-20 DIAGNOSIS — R7303 Prediabetes: Secondary | ICD-10-CM

## 2021-09-20 DIAGNOSIS — I480 Paroxysmal atrial fibrillation: Secondary | ICD-10-CM

## 2021-09-20 LAB — COMPREHENSIVE METABOLIC PANEL
ALT: 15 U/L (ref 0–53)
AST: 14 U/L (ref 0–37)
Albumin: 3.9 g/dL (ref 3.5–5.2)
Alkaline Phosphatase: 65 U/L (ref 39–117)
BUN: 18 mg/dL (ref 6–23)
CO2: 29 mEq/L (ref 19–32)
Calcium: 9.2 mg/dL (ref 8.4–10.5)
Chloride: 105 mEq/L (ref 96–112)
Creatinine, Ser: 1.11 mg/dL (ref 0.40–1.50)
GFR: 71.23 mL/min (ref >60.00)
Glucose, Bld: 106 mg/dL — ABNORMAL HIGH (ref 70–99)
Potassium: 4.4 mEq/L (ref 3.5–5.1)
Sodium: 139 mEq/L (ref 135–145)
Total Bilirubin: 0.8 mg/dL (ref 0.2–1.2)
Total Protein: 6.6 g/dL (ref 6.0–8.3)

## 2021-09-20 LAB — LIPID PANEL
Cholesterol: 151 mg/dL (ref 0–200)
HDL: 50.2 mg/dL (ref >39.00)
LDL Cholesterol: 84 mg/dL (ref 0–99)
NonHDL: 100.48
Total CHOL/HDL Ratio: 3
Triglycerides: 80 mg/dL (ref 0.0–149.0)
VLDL: 16 mg/dL (ref 0.0–40.0)

## 2021-09-20 LAB — HEMOGLOBIN A1C: Hgb A1c MFr Bld: 6.5 % (ref 4.6–6.5)

## 2021-09-20 NOTE — Progress Notes (Signed)
OFFICE VISIT  09/20/2021  CC:  Chief Complaint  Patient presents with   Prediabetes    Pt is fasting   Hyperlipidemia    Patient is a 61 y.o. male who presents for 16-monthfollow-up prediabetes and hypercholesterolemia. A/P as of last visit: "#1 prediabetes. Point-of-care A1c 6.2% today. Given the margin of error for the point-of-care test I think he likely has had no significant change in A1c compared to 3 months ago.  We will hold steady at metformin 500 mg a day.  If not decreasing at the time of next A1c--which will be done by venous sample--then will increase metformin to 500 twice daily.   #2 PAF/flutter. Doing well status post ablation. Cardiologist is continuing Eliquis at least until a repeat echo was done 10/2021, EF goal 55 to 60% before D/C Eliquis."  INTERIM HX: Thomas Hubbard feels well. He still swimming every day and denies any palpitations, racing heart, chest pain, shortness of breath, or dizziness.  He continues on Eliquis as per cardiology recommendations.  Denies any bleeding.  Past Medical History:  Diagnosis Date   Adenomatous colon polyp 2011; 03/2015   2011, 2016, and 11/2020->recall 575yr  Basal cell carcinoma 04/2017   L scapula   Cholelithiasis without cholecystitis    incidentally noted on 2022 pre-ablation cardiac/thoracic MRA   Diverticulosis    Extensor tenosynovitis of right wrist 06/2017   Dr. SmTamala Julian GREAT response to steroid injection.   Hyperlipidemia 2012   TLC helpful initially, but eventually started atorva 03/2019 and had good response.   Left shoulder pain 02/2016   ? calcific bursitis: Dr. HuBing NeighborsSAIDs.  Subacromial bursitis/impingement syndrome dx'd at f/u with Dr. HuLavonna Ruanjection given.  No improvement: MRI showed small insertional RC tear and adhesive capsulitis--steroid injection done again, PT and steroid injections helping as of 06/22/16.   Low back pain    PAF (paroxysmal atrial fibrillation) ([place]   CHADSVASC  score= 0.  [facility] KoVeverly [facility]ss[facility].  Feb 2022 EP MD eval, eventual final dx a-fib and flutter (>a-fib burden than flutter), ablation being considered as of 10/2020.   Prediabetes 02/15/2016   HbA1c 6.1%.  A1c 6.3% 03/2017.  TLC-->A1c hanging around 6.1-6.3 as of 06/2019 (on no med).   Right shoulder pain    subacromial bursitis and AC joint arth: steroid inj's by Dr. SmTamala Julian Adhesive capsulitis 09/2018.   TFCC (triangular fibrocartilage complex) tear    degenerative, right    Past Surgical History:  Procedure Laterality Date   BASAL CELL CARCINOMA EXCISION     CARDIAC MRI  07/09/2020   ALL NORMAL excep small focal region of scarring in basal inferolateral wall, pattern nonspecific.   COLONOSCOPY W/ POLYPECTOMY  10/2009; 03/2015   2011 had 1 polyp, 2016 had 2 polyps. 11/2020 3 polyps: recall 5 yrs   ELECTROPHYSIOLOGIC STUDY  03/09/2021   [facility]   EP ablasion  03/09/2021   Daubert, JaSherrie GeorgeMD   LUMBAR DISC SURGERY  03/2010   L4-5   SHOULDER SURGERY Left 07/2016   Lysis of adhesions (calcific bursitis), bone spur removal, distal clavicle excision.   TRANSTHORACIC ECHOCARDIOGRAM  03/04/2020   In the setting of new dx a-fib: EF 45-50%, mild dilat LV, mod dilat LA, global LV hypokin-->Dr. Croituru to see    Outpatient Medications Prior to Visit  Medication Sig Dispense Refill   atorvastatin (LIPITOR) 20 MG tablet Take 1 tablet (20 mg total) by mouth  daily. 90 tablet 3   ELIQUIS 5 MG TABS tablet PLEASE SEE ATTACHED FOR DETAILED DIRECTIONS     metFORMIN (GLUCOPHAGE) 500 MG tablet Take 1 tablet (500 mg total) by mouth daily with supper. 90 tablet 3   Misc Natural Products (TART CHERRY ADVANCED PO) Take by mouth daily.      Turmeric 500 MG CAPS Take by mouth daily.      No facility-administered medications prior to visit.    Allergies  Allergen Reactions   Poison Ivy Extract Itching and Rash    ROS As per  HPI  PE:    09/20/2021    9:00 AM 06/15/2021    8:02 AM 03/17/2021    9:29 AM  Vitals with BMI  Height 6' 2.75" 6' 2.75" 6' 2.75"  Weight 224 lbs 216 lbs 10 oz 218 lbs 3 oz  BMI 28.18 27.03 50.09  Systolic 381 829 937  Diastolic 61 65 69  Pulse 49 63 63     Physical Exam  Gen: Alert, well appearing.  Patient is oriented to person, place, time, and situation. AFFECT: pleasant, lucid thought and speech. CV: Regular, rate 50, no ectopy, no m/r/g.   LUNGS: CTA bilat, nonlabored resps, good aeration in all lung fields. EXT: no clubbing or cyanosis.  no edema.    LABS:  Last CBC Lab Results  Component Value Date   WBC 5.5 03/17/2021   HGB 14.2 03/17/2021   HCT 43.5 03/17/2021   MCV 96.1 03/17/2021   RDW 14.1 03/17/2021   PLT 221.0 16/96/7893   Last metabolic panel Lab Results  Component Value Date   GLUCOSE 103 (H) 03/17/2021   NA 138 03/17/2021   K 4.7 03/17/2021   CL 101 03/17/2021   CO2 29 03/17/2021   BUN 18 03/17/2021   CREATININE 1.18 03/17/2021   CALCIUM 9.5 03/17/2021   PROT 7.4 03/17/2021   ALBUMIN 4.2 03/17/2021   BILITOT 0.5 03/17/2021   ALKPHOS 67 03/17/2021   AST 14 03/17/2021   ALT 20 03/17/2021   Last lipids Lab Results  Component Value Date   CHOL 146 03/17/2021   HDL 49.20 03/17/2021   LDLCALC 80 03/17/2021   TRIG 85.0 03/17/2021   CHOLHDL 3 03/17/2021   Last hemoglobin A1c Lab Results  Component Value Date   HGBA1C 6.2 (A) 06/15/2021   HGBA1C 6.2 06/15/2021   HGBA1C 6.2 06/15/2021   HGBA1C 6.2 06/15/2021   Last thyroid functions Lab Results  Component Value Date   TSH 2.08 03/17/2021   IMPRESSION AND PLAN:  #1 prediabetes.  Continue with metformin 500 mg at supper. Hemoglobin A1c and fasting glucose today.  Electrolytes and creatinine today.  #2 hyperlipidemia.  Doing well on atorvastatin 20 mg a day. Lipid panel and hepatic panel today.  #3 PAF/flutter. Doing well status post ablation 03/2021. Cardiologist is  continuing Eliquis at least until a repeat echo is done 10/2021, EF goal 55 to 60% before D/C Eliquis.  An After Visit Summary was printed and given to the patient.  FOLLOW UP: Return in about 6 months (around 03/22/2022) for annual CPE (fasting).  Signed:  Crissie Sickles, MD           09/20/2021

## 2021-10-15 ENCOUNTER — Encounter: Payer: Self-pay | Admitting: Family Medicine

## 2021-11-05 ENCOUNTER — Encounter: Payer: Self-pay | Admitting: Family Medicine

## 2021-11-05 NOTE — Telephone Encounter (Signed)
Pt was last seen 09/20/21, next follow up scheduled for 03/2022. During labs on 6/19, "The options are to continue Metformin at one does per day or increase to a morning and evening dose. I recommend he go ahead and increase it. If patient agreeable please send in prescription for 500 mg tabs, one twice a day, 180, refill x 1."   Please review and advise

## 2021-11-08 MED ORDER — ATORVASTATIN CALCIUM 20 MG PO TABS: 20.0000 mg | ORAL_TABLET | Freq: Every day | ORAL | 1 refills | Status: DC

## 2021-11-08 MED ORDER — METFORMIN HCL 500 MG PO TABS
ORAL_TABLET | ORAL | 1 refills | Status: DC
Start: 2021-11-08 — End: 2022-06-03

## 2021-11-08 NOTE — Telephone Encounter (Signed)
Rx's [facility] to [facility] [address], [place]

## 2022-03-22 ENCOUNTER — Ambulatory Visit (INDEPENDENT_AMBULATORY_CARE_PROVIDER_SITE_OTHER): Payer: 59 | Admitting: Family Medicine

## 2022-03-22 ENCOUNTER — Encounter: Payer: Self-pay | Admitting: Family Medicine

## 2022-03-22 VITALS — BP 112/75 | HR 58 | Temp 97.5°F | Ht 75.75 in | Wt 214.4 lb

## 2022-03-22 DIAGNOSIS — R7303 Prediabetes: Secondary | ICD-10-CM

## 2022-03-22 DIAGNOSIS — Z125 Encounter for screening for malignant neoplasm of prostate: Secondary | ICD-10-CM | POA: Diagnosis not present

## 2022-03-22 DIAGNOSIS — Z0000 Encounter for general adult medical examination without abnormal findings: Secondary | ICD-10-CM

## 2022-03-22 DIAGNOSIS — E7800 Pure hypercholesterolemia, unspecified: Secondary | ICD-10-CM | POA: Diagnosis not present

## 2022-03-22 LAB — COMPREHENSIVE METABOLIC PANEL
ALT: 20 U/L (ref 0–53)
AST: 18 U/L (ref 0–37)
Albumin: 4.1 g/dL (ref 3.5–5.2)
Alkaline Phosphatase: 60 U/L (ref 39–117)
BUN: 21 mg/dL (ref 6–23)
CO2: 29 mEq/L (ref 19–32)
Calcium: 9.5 mg/dL (ref 8.4–10.5)
Chloride: 105 mEq/L (ref 96–112)
Creatinine, Ser: 1.12 mg/dL (ref 0.40–1.50)
GFR: 70.22 mL/min (ref >60.00)
Glucose, Bld: 104 mg/dL — ABNORMAL HIGH (ref 70–99)
Potassium: 4.7 mEq/L (ref 3.5–5.1)
Sodium: 140 mEq/L (ref 135–145)
Total Bilirubin: 0.9 mg/dL (ref 0.2–1.2)
Total Protein: 6.9 g/dL (ref 6.0–8.3)

## 2022-03-22 LAB — CBC
HCT: 43.9 % (ref 39.0–52.0)
Hemoglobin: 14.9 g/dL (ref 13.0–17.0)
MCHC: 34 g/dL (ref 30.0–36.0)
MCV: 96.6 fl (ref 78.0–100.0)
Platelets: 207 10*3/uL (ref 150.0–400.0)
RBC: 4.54 Mil/uL (ref 4.22–5.81)
RDW: 13.7 % (ref 11.5–15.5)
WBC: 4.9 10*3/uL (ref 4.0–10.5)

## 2022-03-22 LAB — LIPID PANEL
Cholesterol: 151 mg/dL (ref 0–200)
HDL: 53 mg/dL (ref >39.00)
LDL Cholesterol: 82 mg/dL (ref 0–99)
NonHDL: 97.74
Total CHOL/HDL Ratio: 3
Triglycerides: 78 mg/dL (ref 0.0–149.0)
VLDL: 15.6 mg/dL (ref 0.0–40.0)

## 2022-03-22 LAB — POCT GLYCOSYLATED HEMOGLOBIN (HGB A1C)
HbA1c POC (<> result, manual entry): 6 % (ref 4.0–5.6)
HbA1c, POC (controlled diabetic range): 6 % (ref 0.0–7.0)
HbA1c, POC (prediabetic range): 6 % (ref 5.7–6.4)
Hemoglobin A1C: 6 % — AB (ref 4.0–5.6)

## 2022-03-22 LAB — PSA: PSA: 1.14 ng/mL (ref 0.10–4.00)

## 2022-03-22 LAB — TSH: TSH: 1.78 u[IU]/mL (ref 0.35–5.50)

## 2022-03-22 NOTE — Patient Instructions (Signed)
Health Maintenance, Male Adopting a healthy lifestyle and getting preventive care are important in promoting health and wellness. Ask your health care provider about: The right schedule for you to have regular tests and exams. Things you can do on your own to prevent diseases and keep yourself healthy. What should I know about diet, weight, and exercise? Eat a healthy diet  Eat a diet that includes plenty of vegetables, fruits, low-fat dairy products, and lean protein. Do not eat a lot of foods that are high in solid fats, added sugars, or sodium. Maintain a healthy weight Body mass index (BMI) is a measurement that can be used to identify possible weight problems. It estimates body fat based on height and weight. Your health care provider can help determine your BMI and help you achieve or maintain a healthy weight. Get regular exercise Get regular exercise. This is one of the most important things you can do for your health. Most adults should: Exercise for at least 150 minutes each week. The exercise should increase your heart rate and make you sweat (moderate-intensity exercise). Do strengthening exercises at least twice a week. This is in addition to the moderate-intensity exercise. Spend less time sitting. Even light physical activity can be beneficial. Watch cholesterol and blood lipids Have your blood tested for lipids and cholesterol at 62 years of age, then have this test every 5 years. You may need to have your cholesterol levels checked more often if: Your lipid or cholesterol levels are high. You are older than 62 years of age. You are at high risk for heart disease. What should I know about cancer screening? Many types of cancers can be detected early and may often be prevented. Depending on your health history and family history, you may need to have cancer screening at various ages. This may include screening for: Colorectal cancer. Prostate cancer. Skin cancer. Lung  cancer. What should I know about heart disease, diabetes, and high blood pressure? Blood pressure and heart disease High blood pressure causes heart disease and increases the risk of stroke. This is more likely to develop in people who have high blood pressure readings or are overweight. Talk with your health care provider about your target blood pressure readings. Have your blood pressure checked: Every 3-5 years if you are 18-39 years of age. Every year if you are 40 years old or older. If you are between the ages of 65 and 75 and are a current or former smoker, ask your health care provider if you should have a one-time screening for abdominal aortic aneurysm (AAA). Diabetes Have regular diabetes screenings. This checks your fasting blood sugar level. Have the screening done: Once every three years after age 45 if you are at a normal weight and have a low risk for diabetes. More often and at a younger age if you are overweight or have a high risk for diabetes. What should I know about preventing infection? Hepatitis B If you have a higher risk for hepatitis B, you should be screened for this virus. Talk with your health care provider to find out if you are at risk for hepatitis B infection. Hepatitis C Blood testing is recommended for: Everyone born from 1945 through 1965. Anyone with known risk factors for hepatitis C. Sexually transmitted infections (STIs) You should be screened each year for STIs, including gonorrhea and chlamydia, if: You are sexually active and are younger than 62 years of age. You are older than 62 years of age and your   health care provider tells you that you are at risk for this type of infection. Your sexual activity has changed since you were last screened, and you are at increased risk for chlamydia or gonorrhea. Ask your health care provider if you are at risk. Ask your health care provider about whether you are at high risk for HIV. Your health care provider  may recommend a prescription medicine to help prevent HIV infection. If you choose to take medicine to prevent HIV, you should first get tested for HIV. You should then be tested every 3 months for as long as you are taking the medicine. Follow these instructions at home: Alcohol use Do not drink alcohol if your health care provider tells you not to drink. If you drink alcohol: Limit how much you have to 0-2 drinks a day. Know how much alcohol is in your drink. In the [place] one drink equals one 12 oz bottle of beer (355 mL), one 5 oz glass of wine (148 mL), or one 1 oz glass of hard liquor (44 mL). Lifestyle Do not use any products that contain nicotine or tobacco. These products include cigarettes, chewing tobacco, and vaping devices, such as e-cigarettes. If you need help quitting, ask your health care provider. Do not use street drugs. Do not share needles. Ask your health care provider for help if you need support or information about quitting drugs. General instructions Schedule regular health, dental, and eye exams. Stay current with your vaccines. Tell your health care provider if: You often feel depressed. You have ever been abused or do not feel safe at home. Summary Adopting a healthy lifestyle and getting preventive care are important in promoting health and wellness. Follow your health care provider's instructions about healthy diet, exercising, and getting tested or screened for diseases. Follow your health care provider's instructions on monitoring your cholesterol and blood pressure. This information is not intended to replace advice given to you by your health care provider. Make sure you discuss any questions you have with your health care provider. Document Revised: 08/10/2020 Document Reviewed: 08/10/2020 Elsevier Patient Education  2023 Elsevier Inc.  

## 2022-03-22 NOTE — Progress Notes (Signed)
Office Note 03/22/2022  CC:  Chief Complaint  Patient presents with   Annual Exam    Pt is fasting    HPI:  Patient is a 61 y.o. male who is here for annual health maintenance exam and 23-monthfollow-up prediabetes and hypercholesterolemia.  Thomas Hubbard feels well.  He swims 4 days a week and does some occasional running. He has purposely lost 10 pounds since I last saw him.  Taking metformin 500 twice a day.  Past Medical History:  Diagnosis Date   Adenomatous colon polyp 2011; 03/2015   2011, 2016, and 11/2020->recall 89yr  Basal cell carcinoma 04/2017   L scapula   Cholelithiasis without cholecystitis    incidentally noted on 2022 pre-ablation cardiac/thoracic MRA   Diverticulosis    Extensor tenosynovitis of right wrist 06/2017   Dr. SmTamala Julian GREAT response to steroid injection.   Hyperlipidemia 2012   TLC helpful initially, but eventually started atorva 03/2019 and had good response.   Left shoulder pain 02/2016   ? calcific bursitis: Dr. HuBing NeighborsSAIDs.  Subacromial bursitis/impingement syndrome dx'd at f/u with Dr. HuLavonna Ruanjection given.  No improvement: MRI showed small insertional RC tear and adhesive capsulitis--steroid injection done again, PT and steroid injections helping as of 06/22/16.   Low back pain    [place]   CHADSVASC score= 0.  [facility].  Feb 2022 EP MD eval, eventual final dx a-fib and flutter (>a-fib burden than flutter).Ablation 03/2021.  Eliquis d/c'd 10/11/21.   Prediabetes 02/15/2016   HbA1c 6.1%.  A1c 6.3% 03/2017.  TLC-->A1c hanging around 6.1-6.3 as of 06/2019 (on no med).   Right shoulder pain    subacromial bursitis and AC joint arth: steroid inj's by Dr. SmTamala Julian Adhesive capsulitis 09/2018.   TFCC (triangular fibrocartilage complex) tear    degenerative, right    Past Surgical History:  Procedure Laterality  Date   BASAL CELL CARCINOMA EXCISION     CARDIAC MRI  07/09/2020   ALL NORMAL excep small focal region of scarring in basal inferolateral wall, pattern nonspecific.   COLONOSCOPY W/ POLYPECTOMY  10/2009; 03/2015   2011 had 1 polyp, 2016 had 2 polyps. 11/2020 3 polyps: recall 5 yrs   ELECTROPHYSIOLOGIC STUDY  03/09/2021   [facility]   EP ablasion  03/09/2021   Daubert, JaSherrie GeorgeMD   LUMBAR DISC SURGERY  03/2010   L4-5   SHOULDER SURGERY Left 07/2016   Lysis of adhesions (calcific bursitis), bone spur removal, distal clavicle excision.   TRANSTHORACIC ECHOCARDIOGRAM  03/04/2020   In the setting of new dx a-fib: EF 45-50%, mild dilat LV, mod dilat LA, global LV hypokin.  10/2021 mild LVH o/w normal->eliquis d/c'd    Family History  Problem Relation Age of Onset   Cancer Mother [id number]     Colon   Hyperlipidemia Father     Social History   Socioeconomic History   Marital status: Married    Spouse name: Not on file   Number of children: 3   Years of education: Not on file   Highest education level: Master's degree (e.g., MA, MS, MEng, MEd, MSW, MBA)  Occupational History   Occupation: [employer]  Tobacco Use   Smoking status: Never   Smokeless tobacco: Never  Vaping Use   Vaping Use: Never used  Substance and Sexual Activity   Alcohol use:  Yes    Alcohol/week: 2.0 standard drinks of alcohol    Types: 2 Glasses of wine per week   Drug use: No   Sexual activity: Not on file  Other Topics Concern   Not on file  Social History Narrative   Married, 3 grown children.   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  In 2017 he switched to a [employer] based out of [place], [place].   No T/A/Ds.      Competetive swimmer for many years, still swims as his primary form of exercise.      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, big Set designer   Social Determinants of Health   Financial Resource Strain: Low Risk   (06/12/2021)   Overall Financial Resource Strain (CARDIA)    Difficulty of Paying Living Expenses: Not hard at all  Food Insecurity: No Food Insecurity (06/12/2021)   Hunger Vital Sign    Worried About Running Out of Food in the Last Year: Never true    Ran Out of Food in the Last Year: Never true  Transportation Needs: No Transportation Needs (06/12/2021)   PRAPARE - Hydrologist (Medical): No    Lack of Transportation (Non-Medical): No  Physical Activity: Sufficiently Active (06/12/2021)   Exercise Vital Sign    Days of Exercise per Week: 6 days    Minutes of Exercise per Session: 80 min  Stress: No Stress Concern Present (06/12/2021)   [place]    Feeling of Stress : Only a little  Social Connections: Socially Integrated (06/12/2021)   Social Connection and Isolation Panel [NHANES]    Frequency of Communication with Friends and Family: More than three times a week    Frequency of Social Gatherings with Friends and Family: More than three times a week    Attends Religious Services: More than 4 times per year    Active Member of [employer] or Organizations: Yes    Attends Music therapist: More than 4 times per year    Marital Status: Married  Human resources officer Violence: Not on file    Outpatient Medications Prior to Visit  Medication Sig Dispense Refill   atorvastatin (LIPITOR) 20 MG tablet Take 1 tablet (20 mg total) by mouth daily. 90 tablet 1   metFORMIN (GLUCOPHAGE) 500 MG tablet 1 tab po bid 180 tablet 1   Misc Natural Products (TART CHERRY ADVANCED PO) Take by mouth daily.      Turmeric 500 MG CAPS Take by mouth daily.      ELIQUIS 5 MG TABS tablet PLEASE SEE ATTACHED FOR DETAILED DIRECTIONS     No facility-administered medications prior to visit.    Allergies  Allergen Reactions   Poison Ivy Extract Itching and Rash    ROS Review of Systems  Constitutional:   Negative for appetite change, chills, fatigue and fever.  HENT:  Negative for congestion, dental problem, ear pain and sore throat.   Eyes:  Negative for discharge, redness and visual disturbance.  Respiratory:  Negative for cough, chest tightness, shortness of breath and wheezing.   Cardiovascular:  Negative for chest pain, palpitations and leg swelling.  Gastrointestinal:  Negative for abdominal pain, blood in stool, diarrhea, nausea and vomiting.  Genitourinary:  Negative for difficulty urinating, dysuria, flank pain, frequency, hematuria and urgency.  Musculoskeletal:  Negative for arthralgias, back pain, joint swelling, myalgias and neck stiffness.  Skin:  Negative for pallor and rash.  Neurological:  Negative for dizziness, speech difficulty, weakness and headaches.  Hematological:  Negative for adenopathy. Does not bruise/bleed easily.  Psychiatric/Behavioral:  Negative for confusion and sleep disturbance. The patient is not nervous/anxious.     PE;    03/22/2022    8:01 AM 09/20/2021    9:00 AM 06/15/2021    8:02 AM  Vitals with BMI  Height 6' 3.75" 6' 2.75" 6' 2.75"  Weight 214 lbs 6 oz 224 lbs 216 lbs 10 oz  BMI 26.27 93.81 82.99  Systolic 371 696 789  Diastolic 75 61 65  Pulse 58 49 63    Gen: Alert, well appearing.  Patient is oriented to person, place, time, and situation. AFFECT: pleasant, lucid thought and speech. ENT: Ears: EACs clear, normal epithelium.  TMs with good light reflex and landmarks bilaterally.  Eyes: no injection, icteris, swelling, or exudate.  EOMI, PERRLA. Nose: no drainage or turbinate edema/swelling.  No injection or focal lesion.  Mouth: lips without lesion/swelling.  Oral mucosa pink and moist.  Dentition intact and without obvious caries or gingival swelling.  Oropharynx without erythema, exudate, or swelling.  Neck: supple/nontender.  No LAD, mass, or TM.  Carotid pulses 2+ bilaterally, without bruits. CV: RRR, no m/r/g.   LUNGS: CTA bilat,  nonlabored resps, good aeration in all lung fields. ABD: soft, NT, ND, BS normal.  No hepatospenomegaly or mass.  No bruits. EXT: no clubbing, cyanosis, or edema.  Musculoskeletal: no joint swelling, erythema, warmth, or tenderness.  ROM of all joints intact. Skin - no sores or suspicious lesions or rashes or color changes   Pertinent labs:  Lab Results  Component Value Date   TSH 2.08 03/17/2021   Lab Results  Component Value Date   WBC 5.5 03/17/2021   HGB 14.2 03/17/2021   HCT 43.5 03/17/2021   MCV 96.1 03/17/2021   PLT 221.0 03/17/2021   Lab Results  Component Value Date   CREATININE 1.11 09/20/2021   BUN 18 09/20/2021   NA 139 09/20/2021   K 4.4 09/20/2021   CL 105 09/20/2021   CO2 29 09/20/2021   Lab Results  Component Value Date   ALT 15 09/20/2021   AST 14 09/20/2021   ALKPHOS 65 09/20/2021   BILITOT 0.8 09/20/2021   Lab Results  Component Value Date   CHOL 151 09/20/2021   Lab Results  Component Value Date   HDL 50.20 09/20/2021   Lab Results  Component Value Date   LDLCALC 84 09/20/2021   Lab Results  Component Value Date   TRIG 80.0 09/20/2021   Lab Results  Component Value Date   CHOLHDL 3 09/20/2021   Lab Results  Component Value Date   PSA 1.09 03/17/2021   PSA 0.89 03/17/2020   PSA 1.13 03/15/2019   Lab Results  Component Value Date   HGBA1C 6.5 09/20/2021   ASSESSMENT AND PLAN:   #1 health maintenance exam: Reviewed age and gender appropriate health maintenance issues (prudent diet, regular exercise, health risks of tobacco and excessive alcohol, use of seatbelts, fire alarms in home, use of sunscreen).  Also reviewed age and gender appropriate health screening as well as vaccine recommendations. Vaccines: Tdap->UTD 2017 in [place].  Prevnar 20->declined.   Labs: fasting HP, Hba1c, PSA. Prostate ca screening: PSA today Colon ca screening: recall 11/2025.  #2 prediabetes, doing well on metformin 500 mg twice daily. Point-of-care  hemoglobin A1c was improved to 6.0%. Continue current dosing.  3.  Hyperlipidemia.  Doing well on atorvastatin  20 mg a day. Lipid panel and hepatic panel today.  An After Visit Summary was printed and given to the patient.  FOLLOW UP:  No follow-ups on file.  Signed:  Crissie Sickles, MD           03/22/2022

## 2022-05-12 ENCOUNTER — Encounter (HOSPITAL_COMMUNITY): Payer: Self-pay | Admitting: *Deleted

## 2022-06-03 ENCOUNTER — Other Ambulatory Visit: Payer: Self-pay | Admitting: Family Medicine

## 2022-06-13 ENCOUNTER — Other Ambulatory Visit: Payer: Self-pay

## 2022-06-13 ENCOUNTER — Encounter: Payer: Self-pay | Admitting: Family Medicine

## 2022-06-13 MED ORDER — ATORVASTATIN CALCIUM 20 MG PO TABS: 20.0000 mg | ORAL_TABLET | Freq: Every day | ORAL | 1 refills | Status: DC

## 2022-09-19 NOTE — Patient Instructions (Signed)

## 2022-09-21 ENCOUNTER — Encounter: Payer: Self-pay | Admitting: Family Medicine

## 2022-09-21 ENCOUNTER — Ambulatory Visit: Payer: 59 | Admitting: Family Medicine

## 2022-09-21 VITALS — BP 103/65 | HR 58 | Temp 97.5°F | Ht 75.75 in | Wt 208.2 lb

## 2022-09-21 DIAGNOSIS — I480 Paroxysmal atrial fibrillation: Secondary | ICD-10-CM

## 2022-09-21 DIAGNOSIS — R7303 Prediabetes: Secondary | ICD-10-CM | POA: Diagnosis not present

## 2022-09-21 DIAGNOSIS — E7800 Pure hypercholesterolemia, unspecified: Secondary | ICD-10-CM | POA: Diagnosis not present

## 2022-09-21 LAB — COMPREHENSIVE METABOLIC PANEL
ALT: 14 U/L (ref 0–53)
AST: 16 U/L (ref 0–37)
Albumin: 4.3 g/dL (ref 3.5–5.2)
Alkaline Phosphatase: 63 U/L (ref 39–117)
BUN: 22 mg/dL (ref 6–23)
CO2: 26 mEq/L (ref 19–32)
Calcium: 9.5 mg/dL (ref 8.4–10.5)
Chloride: 107 mEq/L (ref 96–112)
Creatinine, Ser: 1.23 mg/dL (ref 0.40–1.50)
GFR: 62.53 mL/min (ref >60.00)
Glucose, Bld: 99 mg/dL (ref 70–99)
Potassium: 4.3 mEq/L (ref 3.5–5.1)
Sodium: 141 mEq/L (ref 135–145)
Total Bilirubin: 1 mg/dL (ref 0.2–1.2)
Total Protein: 7.4 g/dL (ref 6.0–8.3)

## 2022-09-21 LAB — LIPID PANEL
Cholesterol: 154 mg/dL (ref 0–200)
HDL: 53.8 mg/dL (ref >39.00)
LDL Cholesterol: 84 mg/dL (ref 0–99)
NonHDL: 99.76
Total CHOL/HDL Ratio: 3
Triglycerides: 78 mg/dL (ref 0.0–149.0)
VLDL: 15.6 mg/dL (ref 0.0–40.0)

## 2022-09-21 LAB — POCT GLYCOSYLATED HEMOGLOBIN (HGB A1C)
HbA1c POC (<> result, manual entry): 5.9 % (ref 4.0–5.6)
HbA1c, POC (controlled diabetic range): 5.9 % (ref 0.0–7.0)
HbA1c, POC (prediabetic range): 5.9 % (ref 5.7–6.4)
Hemoglobin A1C: 5.9 % — AB (ref 4.0–5.6)

## 2022-09-21 NOTE — Progress Notes (Signed)
OFFICE VISIT  09/21/2022  CC:  Chief Complaint  Patient presents with   Medical Management of Chronic Issues    Pt is fasting   Patient is a 62 y.o. male who presents for 68-month follow-up prediabetes and hypercholesterolemia. A/P as of last visit: "1 prediabetes, doing well on metformin 500 mg twice daily. Point-of-care hemoglobin A1c was improved to 6.0%. Continue current dosing.   2.  Hyperlipidemia.  Doing well on atorvastatin 20 mg a day. Lipid panel and hepatic panel today."  INTERIM HX: Thomas Hubbard feels very well.  He is swimming every day. No palpitations/A-fib.  Has had lots of travel and family/social events events over the last few months and says he has fallen off of his dietary routine somewhat.  ROS --> no fevers, no CP, no SOB, no wheezing, no cough, no dizziness, no HAs, no rashes, no melena/hematochezia.  No polyuria or polydipsia.  No myalgias or arthralgias.  No focal weakness, paresthesias, or tremors.  No acute vision or hearing abnormalities.  No dysuria or unusual/new urinary urgency or frequency.  No recent changes in lower legs. No n/v/d or abd pain.  No palpitations.     Past Medical History:  Diagnosis Date   Adenomatous colon polyp 2011; 03/2015   2011, 2016, and 11/2020->recall 75yrs   Basal cell carcinoma 04/2017   L scapula   Cholelithiasis without cholecystitis    incidentally noted on 2022 pre-ablation cardiac/thoracic MRA   Diverticulosis    Extensor tenosynovitis of right wrist 06/2017   Dr. Katrinka Blazing.  GREAT response to steroid injection.   Hyperlipidemia 2012   TLC helpful initially, but eventually started atorva 03/2019 and had good response.   Left shoulder pain 02/2016   ? calcific bursitis: Dr. Patsi Sears NSAIDs.  Subacromial bursitis/impingement syndrome dx'd at f/u with Dr. Clinton Gallant injection given.  No improvement: MRI showed small insertional RC tear and adhesive capsulitis--steroid injection done again, PT and steroid injections  helping as of 06/22/16.   Low back pain    PAF (paroxysmal atrial fibrillation) (HCC) 02/2020   CHADSVASC score= 0.  Dr. Leonia Corona, sports med cardiology specialist at [facility] dx'd pt with Athelete heart syndrome.  Feb 2022 EP MD eval, eventual final dx a-fib and flutter (>a-fib burden than flutter).Ablation 03/2021.  Eliquis d/c'd 10/11/21.   Prediabetes 02/15/2016   HbA1c 6.1%.  A1c 6.3% 03/2017.  TLC-->A1c hanging around 6.1-6.3 as of 06/2019 (on no med).   Right shoulder pain    subacromial bursitis and AC joint arth: steroid inj's by Dr. Katrinka Blazing.  Adhesive capsulitis 09/2018.   TFCC (triangular fibrocartilage complex) tear    degenerative, right    Past Surgical History:  Procedure Laterality Date   BASAL CELL CARCINOMA EXCISION     CARDIAC MRI  07/09/2020   ALL NORMAL excep small focal region of scarring in basal inferolateral wall, pattern nonspecific.   COLONOSCOPY W/ POLYPECTOMY  10/2009; 03/2015   2011 had 1 polyp, 2016 had 2 polyps. 11/2020 3 polyps: recall 5 yrs   ELECTROPHYSIOLOGIC STUDY  03/09/2021   [facility]   EP ablasion  03/09/2021   Daubert, Fredderick Erb, MD   LUMBAR DISC SURGERY  03/2010   L4-5   SHOULDER SURGERY Left 07/2016   Lysis of adhesions (calcific bursitis), bone spur removal, distal clavicle excision.   TRANSTHORACIC ECHOCARDIOGRAM  03/04/2020   In the setting of new dx a-fib: EF 45-50%, mild dilat LV, mod dilat LA, global LV hypokin.  10/2021 mild LVH o/w normal->eliquis d/c'd    Outpatient  Medications Prior to Visit  Medication Sig Dispense Refill   atorvastatin (LIPITOR) 20 MG tablet Take 1 tablet (20 mg total) by mouth daily. 90 tablet 1   metFORMIN (GLUCOPHAGE) 500 MG tablet TAKE 1 TABLET BY MOUTH TWICE A DAY 180 tablet 1   Misc Natural Products (TART CHERRY ADVANCED PO) Take by mouth daily.      Turmeric 500 MG CAPS Take by mouth daily.      No facility-administered medications prior to visit.    Allergies  Allergen Reactions   Poison Ivy Extract Itching  and Rash    Review of Systems As per HPI  PE:    09/21/2022    8:02 AM 03/22/2022    8:01 AM 09/20/2021    9:00 AM  Vitals with BMI  Height 6' 3.75" 6' 3.75" 6' 2.75"  Weight 208 lbs 3 oz 214 lbs 6 oz 224 lbs  BMI 25.51 26.27 28.18  Systolic 103 112 161  Diastolic 65 75 61  Pulse 58 58 49     Physical Exam  Gen: Alert, well appearing.  Patient is oriented to person, place, time, and situation. AFFECT: pleasant, lucid thought and speech. CV: RRR, no m/r/g.   LUNGS: CTA bilat, nonlabored resps, good aeration in all lung fields. EXT: no clubbing or cyanosis.  no edema.    LABS:  Last CBC Lab Results  Component Value Date   WBC 4.9 03/22/2022   HGB 14.9 03/22/2022   HCT 43.9 03/22/2022   MCV 96.6 03/22/2022   RDW 13.7 03/22/2022   PLT 207.0 03/22/2022   Last metabolic panel Lab Results  Component Value Date   GLUCOSE 104 (H) 03/22/2022   NA 140 03/22/2022   K 4.7 03/22/2022   CL 105 03/22/2022   CO2 29 03/22/2022   BUN 21 03/22/2022   CREATININE 1.12 03/22/2022   CALCIUM 9.5 03/22/2022   PROT 6.9 03/22/2022   ALBUMIN 4.1 03/22/2022   BILITOT 0.9 03/22/2022   ALKPHOS 60 03/22/2022   AST 18 03/22/2022   ALT 20 03/22/2022   Last lipids Lab Results  Component Value Date   CHOL 151 03/22/2022   HDL 53.00 03/22/2022   LDLCALC 82 03/22/2022   TRIG 78.0 03/22/2022   CHOLHDL 3 03/22/2022   Last hemoglobin A1c Lab Results  Component Value Date   HGBA1C 5.9 (A) 09/21/2022   HGBA1C 5.9 09/21/2022   HGBA1C 5.9 09/21/2022   HGBA1C 5.9 09/21/2022   Last thyroid functions Lab Results  Component Value Date   TSH 1.78 03/22/2022   IMPRESSION AND PLAN:  #1 prediabetes, doing great on metformin 500 mg twice daily. POC Hba1c today is 5.9%. Electrolytes and creatinine today.  2.  Hypercholesterolemia.  Doing great on atorvastatin 20 mg a day. Lipid panel and hepatic panel today.  #3 paroxysmal atrial fibrillation. Doing great since ablation  2022. Follows up with his cardiologist next month.  An After Visit Summary was printed and given to the patient.  FOLLOW UP: Return in about 6 months (around 03/23/2023) for annual CPE (fasting).  Signed:  Santiago Bumpers, MD           09/21/2022

## 2022-11-27 ENCOUNTER — Other Ambulatory Visit: Payer: Self-pay | Admitting: Family Medicine

## 2023-01-05 ENCOUNTER — Other Ambulatory Visit: Payer: Self-pay

## 2023-01-05 ENCOUNTER — Inpatient Hospital Stay: Payer: 59 | Attending: Internal Medicine | Admitting: Genetic Counselor

## 2023-01-05 ENCOUNTER — Inpatient Hospital Stay: Payer: 59

## 2023-01-05 ENCOUNTER — Other Ambulatory Visit: Payer: Self-pay | Admitting: Genetic Counselor

## 2023-01-05 DIAGNOSIS — D239 Other benign neoplasm of skin, unspecified: Secondary | ICD-10-CM

## 2023-01-05 DIAGNOSIS — Z8049 Family history of malignant neoplasm of other genital organs: Secondary | ICD-10-CM

## 2023-01-05 DIAGNOSIS — Z8041 Family history of malignant neoplasm of ovary: Secondary | ICD-10-CM | POA: Diagnosis not present

## 2023-01-05 DIAGNOSIS — Z1379 Encounter for other screening for genetic and chromosomal anomalies: Secondary | ICD-10-CM

## 2023-01-05 DIAGNOSIS — Z800 Family history of malignant neoplasm of digestive organs: Secondary | ICD-10-CM | POA: Diagnosis not present

## 2023-01-05 LAB — GENETIC SCREENING ORDER

## 2023-01-06 ENCOUNTER — Encounter: Payer: Self-pay | Admitting: Genetic Counselor

## 2023-01-06 NOTE — Progress Notes (Signed)
REFERRING PROVIDER: Jeoffrey Massed, MD [id number] [place],  [place] [address]  PRIMARY PROVIDER:  Jeoffrey Massed, MD  PRIMARY REASON FOR VISIT:  Encounter Diagnoses  Name Primary?   Sebaceoma Yes   Family history of colon cancer    Family history of ovarian cancer    Family history of uterine cancer    HISTORY OF PRESENT ILLNESS:   Thomas Hubbard, a 62 y.o. male, was seen for a [place] cancer genetics consultation at the request of Dr. Milinda Cave due to a personal history of a sebaceoma and family history of cancer.  Thomas Hubbard presents to clinic today to discuss the possibility of a hereditary predisposition to cancer, to discuss genetic testing, and to further clarify his future cancer risks, as well as potential cancer risks for family members.   Thomas Hubbard was diagnosed with basal cell carcinoma at ages 4, 64, and 14.  Past Medical History:  Diagnosis Date   Adenomatous colon polyp 2011; 03/2015   2011, 2016, and 11/2020->recall 39yrs   Basal cell carcinoma 04/2017   L scapula   Cholelithiasis without cholecystitis    incidentally noted on 2022 pre-ablation cardiac/thoracic MRA   Diverticulosis    Extensor tenosynovitis of right wrist 06/2017   Dr. Katrinka Blazing.  GREAT response to steroid injection.   Hyperlipidemia 2012   TLC helpful initially, but eventually started atorva 03/2019 and had good response.   Left shoulder pain 02/2016   ? calcific bursitis: Dr. Patsi Sears NSAIDs.  Subacromial bursitis/impingement syndrome dx'd at f/u with Dr. Clinton Gallant injection given.  No improvement: MRI showed small insertional RC tear and adhesive capsulitis--steroid injection done again, PT and steroid injections helping as of 06/22/16.   Low back pain    PAF (paroxysmal atrial fibrillation) (HCC) 02/2020   CHADSVASC score= 0.  Dr. Leonia Corona, sports med cardiology specialist at [facility] dx'd pt with Athelete heart syndrome.  Feb 2022 EP MD eval, eventual final dx a-fib and flutter (>a-fib  burden than flutter).Ablation 03/2021.  Eliquis d/c'd 10/11/21.   Prediabetes 02/15/2016   HbA1c 6.1%.  A1c 6.3% 03/2017.  TLC-->A1c hanging around 6.1-6.3 as of 06/2019 (on no med).   Right shoulder pain    subacromial bursitis and AC joint arth: steroid inj's by Dr. Katrinka Blazing.  Adhesive capsulitis 09/2018.   TFCC (triangular fibrocartilage complex) tear    degenerative, right    Past Surgical History:  Procedure Laterality Date   BASAL CELL CARCINOMA EXCISION     CARDIAC MRI  07/09/2020   ALL NORMAL excep small focal region of scarring in basal inferolateral wall, pattern nonspecific.   COLONOSCOPY W/ POLYPECTOMY  10/2009; 03/2015   2011 had 1 polyp, 2016 had 2 polyps. 11/2020 3 polyps: recall 5 yrs   ELECTROPHYSIOLOGIC STUDY  03/09/2021   [facility]   EP ablasion  03/09/2021   Daubert, Fredderick Erb, MD   LUMBAR DISC SURGERY  03/2010   L4-5   SHOULDER SURGERY Left 07/2016   Lysis of adhesions (calcific bursitis), bone spur removal, distal clavicle excision.   TRANSTHORACIC ECHOCARDIOGRAM  03/04/2020   In the setting of new dx a-fib: EF 45-50%, mild dilat LV, mod dilat LA, global LV hypokin.  10/2021 mild LVH o/w normal->eliquis d/c'd    Social History   Socioeconomic History   Marital status: Married    Spouse name: Not on file   Number of children: 3   Years of education: Not on file   Highest education level: Master's degree (e.g., MA,  MS, MEng, MEd, MSW, MBA)  Occupational History   OccupationHealth and safety inspector: [employer]  Tobacco Use   Smoking status: Never   Smokeless tobacco: Never  Vaping Use   Vaping status: Never Used  Substance and Sexual Activity   Alcohol use: Yes    Alcohol/week: 2.0 standard drinks of alcohol    Types: 2 Glasses of wine per week   Drug use: No   Sexual activity: Not on file  Other Topics Concern   Not on file  Social History Narrative   Married, 3 grown children.   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   No T/A/Ds.      Competetive swimmer for many years, still swims as his primary form of exercise.      [place]   Social Determinants of Health   Financial Resource Strain: Low Risk  (06/12/2021)   Overall Financial Resource Strain (CARDIA)    Difficulty of Paying Living Expenses: Not hard at all  Food Insecurity: No Food Insecurity (09/19/2022)   Hunger Vital Sign    Worried About Running Out of Food in the Last Year: Never true    Ran Out of Food in the Last Year: Never true  Transportation Needs: No Transportation Needs (09/19/2022)   PRAPARE - Administrator, Civil Service (Medical): No    Lack of Transportation (Non-Medical): No  Physical Activity: Sufficiently Active (09/19/2022)   Exercise Vital Sign    Days of Exercise per Week: 6 days    Minutes of Exercise per Session: 80 min  Stress: No Stress Concern Present (09/19/2022)   [employer] of Occupational Health - Occupational Stress Questionnaire    Feeling of Stress : Only a little  Social Connections: Socially Integrated (09/19/2022)   Social Connection and Isolation Panel [NHANES]    Frequency of Communication with Friends and Family: Three times a week    Frequency of Social Gatherings with Friends and Family: Twice a week    Attends Religious Services: More than 4 times per year    Active Member of [employer] or Organizations: Yes    Attends Engineer, structural: More than 4 times per year    Marital Status: Married     FAMILY HISTORY:  We obtained a detailed, 4-generation family history.  Significant diagnoses are listed below: Family History  Problem Relation Age of Onset   Colon cancer Mother 62   Hyperlipidemia Father    Basal cell carcinoma Father 52   Basal cell carcinoma Maternal Uncle    Ovarian cancer Paternal Aunt    Ovarian cancer Paternal Aunt    Breast  cancer Maternal Grandmother 38 - 58   Cancer Paternal Grandfather        eye cancer   Brain cancer Cousin        paternal first cousin   Breast cancer Cousin 50 - 64       paternal first cousin   Uterine cancer Cousin 52       paternal first cousin        Thomas Hubbard sister was diagnosed with basal cell carcinoma at age 70. His mother was diagnosed with colon cancer at age 36 and died at age 61 due to metastatic colon cancer. His maternal uncle was diagnosed with basal cell carcinoma, he died at age 21. His  maternal grandmother was diagnosed with breast cancer in her 22s and died at age 2 due to metastatic breast cancer.   Thomas Hubbard father was diagnosed with basal cell carcinoma at age 26, he died at age 77. Two paternal aunts have a history of ovarian cancer at unknown ages, they are deceased. One paternal cousin was diagnosed with breast cancer in her 86s, she died due to metastatic breast cancer at age 63. One paternal cousin was diagnosed with uterine cancer at age 5, she was recently diagnosed with recurrent uterine cancer at age 3. One paternal cousin was diagnosed with brain cancer at an unknown age, he died at age 25. His paternal grandfather died due to metastatic eye cancer at age 49. Thomas Hubbard sister reportedly had genetic testing but does not remember the results. There is no reported Ashkenazi Jewish ancestry.   GENETIC COUNSELING ASSESSMENT: Thomas Hubbard is a 62 y.o. male with a personal history of a sebaceoma and family history of cancer which is somewhat suggestive of a hereditary predisposition to cancer. We, therefore, discussed and recommended the following at today's visit.   DISCUSSION: We discussed that 5 - 10% of cancer is hereditary, with most cases of colon cancer associated with Lynch Syndrome. There are other genes that can be associated with hereditary colon cancer syndromes.  We discussed that testing is beneficial for several reasons including knowing how to  follow individuals for cancer screening and understanding if other family members could be at risk for cancer and allowing them to undergo genetic testing.   We reviewed the characteristics, features and inheritance patterns of hereditary cancer syndromes. We also discussed genetic testing, including the appropriate family members to test, the process of testing, insurance coverage and turn-around-time for results. We discussed the implications of a negative, positive, carrier and/or variant of uncertain significant result. We recommended Thomas Hubbard pursue genetic testing for a panel that includes genes associated with colon, breast, ovarian, and uterine cancer.   Thomas Hubbard elected to have [facility] CustomNext Panel. The CustomrNext gene panel offered by [employer] includes sequencing, rearrangement analysis, and RNA analysis for the following 39 genes:   APC, ATM, AXIN2, BARD1, BAP1, BMPR1A, BRCA1, BRCA2, BRIP1, CDH1, CDK4, CDKN2A, CHEK2, DICER1, HOXB13, EPCAM, GREM1, MITF, MLH1, MSH2, MSH3, MSH6, MUTYH, NF1, NTHL1, PALB2, PMS2, POLD1, POLE, POT1, PTCH1, PTEN, RAD51C, RAD51D, RB1, SMAD4, SMARCA4, STK11, and TP53.  Based on Thomas Hubbard family history of cancer, he meets medical criteria for genetic testing. Despite that he meets criteria, he may still have an out of pocket cost. We discussed that if his out of pocket cost for testing is over $100, the laboratory should contact them to discuss self-pay prices, patient pay assistance programs, if applicable, and other billing options.  PLAN: After considering the risks, benefits, and limitations, Thomas Hubbard provided informed consent to pursue genetic testing and the blood sample was sent to [facility] for analysis of the CustomNext Panel. Results should be available within approximately 2-3 weeks' time, at which point they will be disclosed by telephone to Thomas Hubbard, as will any additional recommendations warranted by these results. Thomas Hubbard will  receive a summary of his genetic counseling visit and a copy of his results once available. This information will also be available in Epic.   Thomas Hubbard questions were answered to his satisfaction today. Our contact information was provided should additional questions or concerns arise. Thank you for the referral and allowing [place] to share in the care of your  patient.   Lalla Brothers, MS, [facility] Genetic Counselor [email or web address] (P) [phone]  The patient was seen for a total of 30 minutes in face-to-face genetic counseling.  The patient was seen alone.  Drs. Pamelia Hoit and/or Mosetta Putt were available to discuss this case as needed.   _______________________________________________________________________ For Office Staff:  Number of people involved in session: 1 Was an Intern/ student involved with case: no

## 2023-01-19 ENCOUNTER — Telehealth: Payer: Self-pay | Admitting: Genetic Counselor

## 2023-01-19 ENCOUNTER — Encounter: Payer: Self-pay | Admitting: Genetic Counselor

## 2023-01-19 DIAGNOSIS — Z1379 Encounter for other screening for genetic and chromosomal anomalies: Secondary | ICD-10-CM | POA: Insufficient documentation

## 2023-01-19 NOTE — Telephone Encounter (Signed)
I contacted Thomas Hubbard to discuss his genetic testing results. No pathogenic variants were identified in the 39 genes analyzed. Detailed clinic note to follow.  The test report has been scanned into EPIC and is located under the Molecular Pathology section of the Results Review tab.  A portion of the result report is included below for reference.   Lalla Brothers, MS, [facility] Genetic Counselor [email or web address] (P) [phone]

## 2023-01-27 ENCOUNTER — Encounter: Payer: Self-pay | Admitting: Genetic Counselor

## 2023-01-27 ENCOUNTER — Ambulatory Visit: Payer: Self-pay | Admitting: Genetic Counselor

## 2023-01-27 DIAGNOSIS — Z1379 Encounter for other screening for genetic and chromosomal anomalies: Secondary | ICD-10-CM

## 2023-01-27 NOTE — Progress Notes (Signed)
HPI:   Thomas Hubbard was previously seen in the [facility] due to a personal history of a sebaceoma/basal cell carcinoma and family history of cancer. Please refer to our prior cancer genetics clinic note for more information regarding our discussion, assessment and recommendations, at the time. Thomas Hubbard recent genetic test results were disclosed to him, as were recommendations warranted by these results. These results and recommendations are discussed in more detail below.  CANCER HISTORY:  Thomas Hubbard was diagnosed with basal cell carcinoma at ages 34, 11, and 16.   FAMILY HISTORY:  We obtained a detailed, 4-generation family history.  Significant diagnoses are listed below:      Family History  Problem Relation Age of Onset   Colon cancer Hubbard 35   Hyperlipidemia Father     Basal cell carcinoma Father 44   Basal cell carcinoma Maternal Uncle     Ovarian cancer Paternal Aunt     Ovarian cancer Paternal Aunt     Breast cancer Maternal Grandmother 27 - 79   Cancer Paternal Grandfather          eye cancer   Brain cancer Cousin          paternal first cousin   Breast cancer Cousin 38 - 93        paternal first cousin   Uterine cancer Cousin 55        paternal first cousin                 Thomas Hubbard sister was diagnosed with basal cell carcinoma at age 28. His Hubbard was diagnosed with colon cancer at age 68 and died at age 63 due to metastatic colon cancer. His maternal uncle was diagnosed with basal cell carcinoma, he died at age 32. His maternal grandmother was diagnosed with breast cancer in her 41s and died at age 23 due to metastatic breast cancer.    Thomas Hubbard father was diagnosed with basal cell carcinoma at age 34, he died at age 58. Two paternal aunts have a history of ovarian cancer at unknown ages, they are deceased. One paternal cousin was diagnosed with breast cancer in her 27s, she died due to metastatic breast cancer at age 45. One paternal  cousin was diagnosed with uterine cancer at age 59, she was recently diagnosed with recurrent uterine cancer at age 63. One paternal cousin was diagnosed with brain cancer at an unknown age, he died at age 28. His paternal grandfather died due to metastatic eye cancer at age 42. Thomas Hubbard sister reportedly had genetic testing but does not remember the results. There is no reported Ashkenazi Jewish ancestry.    GENETIC TEST RESULTS:  The [facility] CustomNext Panel found no pathogenic mutations.   The CustomNext gene panel offered by [employer] includes sequencing, rearrangement analysis, and RNA analysis for the following 39 genes:   APC, ATM, AXIN2, BARD1, BAP1, BMPR1A, BRCA1, BRCA2, BRIP1, CDH1, CDK4, CDKN2A, CHEK2, DICER1, HOXB13, EPCAM, GREM1, MITF, MLH1, MSH2, MSH3, MSH6, MUTYH, NF1, NTHL1, PALB2, PMS2, POLD1, POLE, POT1, PTCH1, PTEN, RAD51C, RAD51D, RB1, SMAD4, SMARCA4, STK11, and TP53.   The test report has been scanned into EPIC and is located under the Molecular Pathology section of the Results Review tab.  A portion of the result report is included below for reference. Genetic testing reported out on 01/18/2023.       Even though a pathogenic variant was not identified, possible explanations for the cancer in the family  may include: There may be no hereditary risk for cancer in the family. The cancers in Thomas Hubbard and/or his family may be due to other genetic or environmental factors. There may be a gene mutation in one of these genes that current testing methods cannot detect, but that chance is small. There could be another gene that has not yet been discovered, or that we have not yet tested, that is responsible for the cancer diagnoses in the family.  It is also possible there is a hereditary cause for the cancer in the family that Thomas Hubbard.  Therefore, it is important to remain in touch with cancer genetics in the future so that we can continue to offer Thomas Hubbard  the most up to date genetic testing.   ADDITIONAL GENETIC TESTING:  We discussed with Thomas Hubbard that his genetic testing was fairly extensive.  If there are genes identified to increase cancer risk that can be analyzed in the future, we would be happy to discuss and coordinate this testing at that time.    CANCER SCREENING RECOMMENDATIONS:  Thomas Hubbard test result is considered negative (normal).  This means that we have not identified a hereditary cause for his personal and family history of cancer at this time.   An individual's cancer risk and medical management are not determined by genetic test results alone. Overall cancer risk assessment incorporates additional factors, including personal medical history, family history, and any available genetic information that may result in a personalized plan for cancer prevention and surveillance. Therefore, it is recommended he continue to follow the cancer management and screening guidelines provided by his healthcare providers.  Based on the reported personal and family history, specific cancer screenings for Thomas Hubbard and his family include:  Colon Cancer Screening: Due to Thomas Hubbard's history of colon cancer, he is recommended to repeat colonoscopies every 5 years. More frequent colonoscopies may be recommended if polyps are identified.  RECOMMENDATIONS FOR FAMILY MEMBERS:   Since he did not Hubbard a mutation in a cancer predisposition gene included on this panel, his children could not have inherited a mutation from him in one of these genes. Other members of the family may still carry a pathogenic variant in one of these genes that Thomas Hubbard. Fletes did not Hubbard. Based on the family history, we recommend his siblings have genetic counseling and testing.   FOLLOW-UP:  Cancer genetics is a rapidly advancing field and it is possible that new genetic tests will be appropriate for him and/or his family members in the future. We encouraged  him to remain in contact with cancer genetics on an annual basis so we can update his personal and family histories and let him know of advances in cancer genetics that may benefit this family.   Our contact number was provided. Thomas Hubbard. [place] questions were answered to his satisfaction, and he knows he is welcome to call us at anytime with additional questions or concerns.   Lalla Brothers, MS, [facility] Genetic Counselor [email or web address] (P) [phone]

## 2023-03-27 ENCOUNTER — Ambulatory Visit: Payer: 59 | Admitting: Family Medicine

## 2023-03-27 ENCOUNTER — Encounter: Payer: Self-pay | Admitting: Family Medicine

## 2023-03-27 VITALS — BP 136/77 | HR 52 | Ht 75.5 in | Wt 216.6 lb

## 2023-03-27 DIAGNOSIS — Z0000 Encounter for general adult medical examination without abnormal findings: Secondary | ICD-10-CM | POA: Diagnosis not present

## 2023-03-27 DIAGNOSIS — Z125 Encounter for screening for malignant neoplasm of prostate: Secondary | ICD-10-CM | POA: Diagnosis not present

## 2023-03-27 DIAGNOSIS — E7800 Pure hypercholesterolemia, unspecified: Secondary | ICD-10-CM | POA: Diagnosis not present

## 2023-03-27 DIAGNOSIS — R7303 Prediabetes: Secondary | ICD-10-CM

## 2023-03-27 LAB — CBC
HCT: 44.6 % (ref 39.0–52.0)
Hemoglobin: 14.6 g/dL (ref 13.0–17.0)
MCHC: 32.8 g/dL (ref 30.0–36.0)
MCV: 98 fL (ref 78.0–100.0)
Platelets: 226 10*3/uL (ref 150.0–400.0)
RBC: 4.55 Mil/uL (ref 4.22–5.81)
RDW: 13.8 % (ref 11.5–15.5)
WBC: 5.5 10*3/uL (ref 4.0–10.5)

## 2023-03-27 LAB — POCT GLYCOSYLATED HEMOGLOBIN (HGB A1C)
HbA1c POC (<> result, manual entry): 6.2 % (ref 4.0–5.6)
HbA1c, POC (controlled diabetic range): 6.2 % (ref 0.0–7.0)
HbA1c, POC (prediabetic range): 6.2 % (ref 5.7–6.4)
Hemoglobin A1C: 6.2 % — AB (ref 4.0–5.6)

## 2023-03-27 LAB — LIPID PANEL
Cholesterol: 157 mg/dL (ref 0–200)
HDL: 54.2 mg/dL (ref >39.00)
LDL Cholesterol: 88 mg/dL (ref 0–99)
NonHDL: 102.31
Total CHOL/HDL Ratio: 3
Triglycerides: 72 mg/dL (ref 0.0–149.0)
VLDL: 14.4 mg/dL (ref 0.0–40.0)

## 2023-03-27 LAB — COMPREHENSIVE METABOLIC PANEL
ALT: 14 U/L (ref 0–53)
AST: 13 U/L (ref 0–37)
Albumin: 4 g/dL (ref 3.5–5.2)
Alkaline Phosphatase: 67 U/L (ref 39–117)
BUN: 20 mg/dL (ref 6–23)
CO2: 29 meq/L (ref 19–32)
Calcium: 9.2 mg/dL (ref 8.4–10.5)
Chloride: 106 meq/L (ref 96–112)
Creatinine, Ser: 1.15 mg/dL (ref 0.40–1.50)
GFR: 67.54 mL/min (ref >60.00)
Glucose, Bld: 121 mg/dL — ABNORMAL HIGH (ref 70–99)
Potassium: 4.8 meq/L (ref 3.5–5.1)
Sodium: 139 meq/L (ref 135–145)
Total Bilirubin: 0.4 mg/dL (ref 0.2–1.2)
Total Protein: 6.8 g/dL (ref 6.0–8.3)

## 2023-03-27 LAB — PSA: PSA: 0.98 ng/mL (ref 0.10–4.00)

## 2023-03-27 LAB — TSH: TSH: 2.3 u[IU]/mL (ref 0.35–5.50)

## 2023-03-27 MED ORDER — METFORMIN HCL 500 MG PO TABS: ORAL_TABLET | ORAL | 3 refills | Status: DC

## 2023-03-27 MED ORDER — ATORVASTATIN CALCIUM 20 MG PO TABS: 20.0000 mg | ORAL_TABLET | Freq: Every day | ORAL | 3 refills | Status: DC

## 2023-03-27 NOTE — Progress Notes (Signed)
Office Note 03/27/2023  CC:  Chief Complaint  Patient presents with   Annual Exam    Pt is fasting.    HPI:  Patient is a 62 y.o. male who is here for annual health maintenance exam and follow-up prediabetes and hypercholesterolemia.  Thomas Hubbard is feeling well. He is swimming every morning. Diet not quite as good last several months.  Due to his cardiac condition he will be starting Eliquis in February next year based on his age.  Past Medical History:  Diagnosis Date   Adenomatous colon polyp 2011; 03/2015   2011, 2016, and 11/2020->recall 48yrs   Basal cell carcinoma 04/2017   L scapula   Cholelithiasis without cholecystitis    incidentally noted on 2022 pre-ablation cardiac/thoracic MRA   Diverticulosis    Extensor tenosynovitis of right wrist 06/2017   Dr. Katrinka Blazing.  GREAT response to steroid injection.   Hyperlipidemia 2012   TLC helpful initially, but eventually started atorva 03/2019 and had good response.   Left shoulder pain 02/2016   ? calcific bursitis: Dr. Patsi Sears NSAIDs.  Subacromial bursitis/impingement syndrome dx'd at f/u with Dr. Clinton Gallant injection given.  No improvement: MRI showed small insertional RC tear and adhesive capsulitis--steroid injection done again, PT and steroid injections helping as of 06/22/16.   Low back pain    PAF (paroxysmal atrial fibrillation) (HCC) 02/2020   CHADSVASC score= 0.  Dr. Leonia Corona, sports med cardiology specialist at [facility] dx'd pt with Athelete heart syndrome.  Feb 2022 EP MD eval, eventual final dx a-fib and flutter (>a-fib burden than flutter).Ablation 03/2021.  Eliquis d/c'd 10/11/21.   Prediabetes 02/15/2016   HbA1c 6.1%.  A1c 6.3% 03/2017.  TLC-->A1c hanging around 6.1-6.3 as of 06/2019 (on no med).   Right shoulder pain    subacromial bursitis and AC joint arth: steroid inj's by Dr. Katrinka Blazing.  Adhesive capsulitis 09/2018.   TFCC (triangular fibrocartilage complex) tear    degenerative, right    Past Surgical History:   Procedure Laterality Date   BASAL CELL CARCINOMA EXCISION     CARDIAC MRI  07/09/2020   ALL NORMAL excep small focal region of scarring in basal inferolateral wall, pattern nonspecific.   COLONOSCOPY W/ POLYPECTOMY  10/2009; 03/2015   2011 had 1 polyp, 2016 had 2 polyps. 11/2020 3 polyps: recall 5 yrs   ELECTROPHYSIOLOGIC STUDY  03/09/2021   [facility]   EP ablasion  03/09/2021   Daubert, Fredderick Erb, MD   LUMBAR DISC SURGERY  03/2010   L4-5   SHOULDER SURGERY Left 07/2016   Lysis of adhesions (calcific bursitis), bone spur removal, distal clavicle excision.   skin cancer removal     TRANSTHORACIC ECHOCARDIOGRAM  03/04/2020   In the setting of new dx a-fib: EF 45-50%, mild dilat LV, mod dilat LA, global LV hypokin.  10/2021 mild LVH o/w normal->eliquis d/c'd    Family History  Problem Relation Age of Onset   Colon cancer Mother 59   Hyperlipidemia Father    Basal cell carcinoma Father 34   Basal cell carcinoma Maternal Uncle    Ovarian cancer Paternal Aunt    Ovarian cancer Paternal Aunt    Breast cancer Maternal Grandmother 34 - 1   Cancer Paternal Grandfather        eye cancer   Brain cancer Cousin        paternal first cousin   Breast cancer Cousin 49 - 62       paternal first cousin   Uterine cancer Cousin 24  paternal first cousin    Social History   Socioeconomic History   Marital status: Married    Spouse name: Not on file   Number of children: 3   Years of education: Not on file   Highest education level: Master's degree (e.g., MA, MS, MEng, MEd, MSW, MBA)  Occupational History   Occupation: Journalist, newspaper: [employer]  Tobacco Use   Smoking status: Never   Smokeless tobacco: Never  Vaping Use   Vaping status: Never Used  Substance and Sexual Activity   Alcohol use: Yes    Alcohol/week: 2.0 standard drinks of alcohol    Types: 2 Glasses of wine per week   Drug use: No   Sexual activity: Not on file  Other Topics Concern   Not on  file  Social History Narrative   Married, 3 grown children.   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   No T/A/Ds.      Competetive swimmer for many years, still swims as his primary form of exercise.      Originally from [place]bal[place]   Social Drivers of Health   Financial Resource Strain: Low Risk  (06/12/2021)   Overall Financial Resource Strain (CARDIA)    Difficulty of Paying Living Expenses: Not hard at all  Food Insecurity: No Food Insecurity (09/19/2022)   Hunger Vital Sign    Worried About Running Out of Food in the Last Year: Never true    Ran Out of Food in the Last Year: Never true  Transportation Needs: No Transportation Needs (09/19/2022)   PRAPARE - Administrator, Civil Service (Medical): No    Lack of Transportation (Non-Medical): No  Physical Activity: Sufficiently Active (09/19/2022)   Exercise Vital Sign    Days of Exercise per Week: 6 days    Minutes of Exercise per Session: 80 min  Stress: No Stress Concern Present (09/19/2022)   [employer] of Occupational Health - Occupational Stress Questionnaire    Feeling of Stress : Only a little  Social Connections: Socially Integrated (09/19/2022)   Social Connection and Isolation Panel [NHANES]    Frequency of Communication with Friends and Family: Three times a week    Frequency of Social Gatherings with Friends and Family: Twice a week    Attends Religious Services: More than 4 times per year    Active Member of [employer] or Organizations: Yes    Attends Engineer, structural: More than 4 times per year    Marital Status: Married  Catering manager Violence: Not on file    Outpatient Medications Prior to Visit  Medication Sig Dispense Refill   Misc Natural Products (TART CHERRY ADVANCED PO) Take by mouth daily.      Turmeric 500 MG CAPS Take by mouth daily.       atorvastatin (LIPITOR) 20 MG tablet TAKE 1 TABLET BY MOUTH EVERY DAY 90 tablet 1   metFORMIN (GLUCOPHAGE) 500 MG tablet TAKE 1 TABLET BY MOUTH TWICE A DAY 180 tablet 1   No facility-administered medications prior to visit.    Allergies  Allergen Reactions   Poison Ivy Extract Itching and Rash    Review of Systems  Constitutional:  Negative for appetite change, chills, fatigue and fever.  HENT:  Negative for congestion, dental problem, ear pain and sore throat.   Eyes:  Negative for discharge,  redness and visual disturbance.  Respiratory:  Negative for cough, chest tightness, shortness of breath and wheezing.   Cardiovascular:  Negative for chest pain, palpitations and leg swelling.  Gastrointestinal:  Negative for abdominal pain, blood in stool, diarrhea, nausea and vomiting.  Genitourinary:  Negative for difficulty urinating, dysuria, flank pain, frequency, hematuria and urgency.  Musculoskeletal:  Negative for arthralgias, back pain, joint swelling, myalgias and neck stiffness.  Skin:  Negative for pallor and rash.  Neurological:  Negative for dizziness, speech difficulty, weakness and headaches.  Hematological:  Negative for adenopathy. Does not bruise/bleed easily.  Psychiatric/Behavioral:  Negative for confusion and sleep disturbance. The patient is not nervous/anxious.    PE;    03/27/2023    8:01 AM 09/21/2022    8:02 AM 03/22/2022    8:01 AM  Vitals with BMI  Height 6' 3.5" 6' 3.75" 6' 3.75"  Weight 216 lbs 10 oz 208 lbs 3 oz 214 lbs 6 oz  BMI 26.71 25.51 26.27  Systolic 136 103 161  Diastolic 77 65 75  Pulse 52 58 58   Gen: Alert, well appearing.  Patient is oriented to person, place, time, and situation. AFFECT: pleasant, lucid thought and speech. ENT: Ears: EACs clear, normal epithelium.  TMs with good light reflex and landmarks bilaterally.  Eyes: no injection, icteris, swelling, or exudate.  EOMI, PERRLA. Nose: no drainage or turbinate edema/swelling.  No  injection or focal lesion.  Mouth: lips without lesion/swelling.  Oral mucosa pink and moist.  Dentition intact and without obvious caries or gingival swelling.  Oropharynx without erythema, exudate, or swelling.  Neck: supple/nontender.  No LAD, mass, or TM.  Carotid pulses 2+ bilaterally, without bruits. CV: RRR, no m/r/g.   LUNGS: CTA bilat, nonlabored resps, good aeration in all lung fields. ABD: soft, NT, ND, BS normal.  No hepatospenomegaly or mass.  No bruits. EXT: no clubbing, cyanosis, or edema.  Musculoskeletal: no joint swelling, erythema, warmth, or tenderness.  ROM of all joints intact. Skin - no sores or suspicious lesions or rashes or color changes  Pertinent labs:  Lab Results  Component Value Date   TSH 1.78 03/22/2022   Lab Results  Component Value Date   WBC 4.9 03/22/2022   HGB 14.9 03/22/2022   HCT 43.9 03/22/2022   MCV 96.6 03/22/2022   PLT 207.0 03/22/2022   Lab Results  Component Value Date   CREATININE 1.23 09/21/2022   BUN 22 09/21/2022   NA 141 09/21/2022   K 4.3 09/21/2022   CL 107 09/21/2022   CO2 26 09/21/2022   Lab Results  Component Value Date   ALT 14 09/21/2022   AST 16 09/21/2022   ALKPHOS 63 09/21/2022   BILITOT 1.0 09/21/2022   Lab Results  Component Value Date   CHOL 154 09/21/2022   Lab Results  Component Value Date   HDL 53.80 09/21/2022   Lab Results  Component Value Date   LDLCALC 84 09/21/2022   Lab Results  Component Value Date   TRIG 78.0 09/21/2022   Lab Results  Component Value Date   CHOLHDL 3 09/21/2022   Lab Results  Component Value Date   PSA 1.14 03/22/2022   PSA 1.09 03/17/2021   PSA 0.89 03/17/2020   Lab Results  Component Value Date   HGBA1C 5.9 (A) 09/21/2022   HGBA1C 5.9 09/21/2022   HGBA1C 5.9 09/21/2022   HGBA1C 5.9 09/21/2022   ASSESSMENT AND PLAN:   1) Health maintenance exam: Reviewed age and gender  appropriate health maintenance issues (prudent diet, regular exercise, health risks  of tobacco and excessive alcohol, use of seatbelts, fire alarms in home, use of sunscreen).  Also reviewed age and gender appropriate health screening as well as vaccine recommendations. Vaccines: Tdap->UTD 2017 in [place].  Flu->declined. Labs: fasting HP, Hba1c, PSA. Prostate ca screening: PSA today Colon ca screening: recall 11/2025.  #2 prediabetes, A1c up a little bit to 6.2% today. He will work on diet.  Exercise is already excellent. Continue metformin 500 mg twice a day.  #3 hypercholesterolemia, continue atorvastatin 20 mg a day. Lipid panel and hepatic panel today.  An After Visit Summary was printed and given to the patient.  FOLLOW UP:  Return in about 6 months (around 09/25/2023) for routine chronic illness f/u.  Signed:  Santiago Bumpers, MD           03/27/2023

## 2023-03-27 NOTE — Patient Instructions (Signed)
Health Maintenance, Male Adopting a healthy lifestyle and getting preventive care are important in promoting health and wellness. Ask your health care provider about: The right schedule for you to have regular tests and exams. Things you can do on your own to prevent diseases and keep yourself healthy. What should I know about diet, weight, and exercise? Eat a healthy diet  Eat a diet that includes plenty of vegetables, fruits, low-fat dairy products, and lean protein. Do not eat a lot of foods that are high in solid fats, added sugars, or sodium. Maintain a healthy weight Body mass index (BMI) is a measurement that can be used to identify possible weight problems. It estimates body fat based on height and weight. Your health care provider can help determine your BMI and help you achieve or maintain a healthy weight. Get regular exercise Get regular exercise. This is one of the most important things you can do for your health. Most adults should: Exercise for at least 150 minutes each week. The exercise should increase your heart rate and make you sweat (moderate-intensity exercise). Do strengthening exercises at least twice a week. This is in addition to the moderate-intensity exercise. Spend less time sitting. Even light physical activity can be beneficial. Watch cholesterol and blood lipids Have your blood tested for lipids and cholesterol at 63 years of age, then have this test every 5 years. You may need to have your cholesterol levels checked more often if: Your lipid or cholesterol levels are high. You are older than 63 years of age. You are at high risk for heart disease. What should I know about cancer screening? Many types of cancers can be detected early and may often be prevented. Depending on your health history and family history, you may need to have cancer screening at various ages. This may include screening for: Colorectal cancer. Prostate cancer. Skin cancer. Lung  cancer. What should I know about heart disease, diabetes, and high blood pressure? Blood pressure and heart disease High blood pressure causes heart disease and increases the risk of stroke. This is more likely to develop in people who have high blood pressure readings or are overweight. Talk with your health care provider about your target blood pressure readings. Have your blood pressure checked: Every 3-5 years if you are 18-39 years of age. Every year if you are 40 years old or older. If you are between the ages of 65 and 75 and are a current or former smoker, ask your health care provider if you should have a one-time screening for abdominal aortic aneurysm (AAA). Diabetes Have regular diabetes screenings. This checks your fasting blood sugar level. Have the screening done: Once every three years after age 45 if you are at a normal weight and have a low risk for diabetes. More often and at a younger age if you are overweight or have a high risk for diabetes. What should I know about preventing infection? Hepatitis B If you have a higher risk for hepatitis B, you should be screened for this virus. Talk with your health care provider to find out if you are at risk for hepatitis B infection. Hepatitis C Blood testing is recommended for: Everyone born from 1945 through 1965. Anyone with known risk factors for hepatitis C. Sexually transmitted infections (STIs) You should be screened each year for STIs, including gonorrhea and chlamydia, if: You are sexually active and are younger than 63 years of age. You are older than 63 years of age and your   health care provider tells you that you are at risk for this type of infection. Your sexual activity has changed since you were last screened, and you are at increased risk for chlamydia or gonorrhea. Ask your health care provider if you are at risk. Ask your health care provider about whether you are at high risk for HIV. Your health care provider  may recommend a prescription medicine to help prevent HIV infection. If you choose to take medicine to prevent HIV, you should first get tested for HIV. You should then be tested every 3 months for as long as you are taking the medicine. Follow these instructions at home: Alcohol use Do not drink alcohol if your health care provider tells you not to drink. If you drink alcohol: Limit how much you have to 0-2 drinks a day. Know how much alcohol is in your drink. In the [place] one drink equals one 12 oz bottle of beer (355 mL), one 5 oz glass of wine (148 mL), or one 1 oz glass of hard liquor (44 mL). Lifestyle Do not use any products that contain nicotine or tobacco. These products include cigarettes, chewing tobacco, and vaping devices, such as e-cigarettes. If you need help quitting, ask your health care provider. Do not use street drugs. Do not share needles. Ask your health care provider for help if you need support or information about quitting drugs. General instructions Schedule regular health, dental, and eye exams. Stay current with your vaccines. Tell your health care provider if: You often feel depressed. You have ever been abused or do not feel safe at home. Summary Adopting a healthy lifestyle and getting preventive care are important in promoting health and wellness. Follow your health care provider's instructions about healthy diet, exercising, and getting tested or screened for diseases. Follow your health care provider's instructions on monitoring your cholesterol and blood pressure. This information is not intended to replace advice given to you by your health care provider. Make sure you discuss any questions you have with your health care provider. Document Revised: 08/10/2020 Document Reviewed: 08/10/2020 Elsevier Patient Education  2024 Elsevier Inc.  

## 2023-07-03 ENCOUNTER — Ambulatory Visit: Payer: Self-pay

## 2023-07-03 NOTE — Telephone Encounter (Signed)
 Copied from CRM [id number]. Topic: Clinical - Red Word Triage >> Jul 03, 2023 10:46 AM Raven B wrote: Red Word that prompted transfer to Nurse Triage: Patient has increasing pain in left shoulder.  Chief Complaint: left shoulder pain Symptoms: pain Frequency: constant when uses arm Pertinent Negatives: Patient denies fever, numbness, Disposition: [] ED /[] Urgent Care (no appt availability in office) / [] Appointment(In office/virtual)/ []  [place] [facility]/ [x] Home Care/ [] Refused Recommended Disposition /[]  Mobile Bus/ []  Follow-up with PCP Additional Notes: Called Dr. Herma Carson. Smith's office to call patient to schedule the visit.  Per protocol should be seen.  Care advice given, denies questions; instructed to go to ER if becomes worse.   Reason for Disposition  [1] MODERATE pain (e.g., interferes with normal activities) AND [2] present > 3 days  Answer Assessment - Initial Assessment Questions 1. ONSET: "When did the pain start?"     A week ago 2. LOCATION: "Where is the pain located?"     Left shoulder 3. PAIN: "How bad is the pain?" (Scale 1-10; or mild, moderate, severe)   - MILD (1-3): doesn't interfere with normal activities   - MODERATE (4-7): interferes with normal activities (e.g., work or school) or awakens from sleep   - SEVERE (8-10): excruciating pain, unable to do any normal activities, unable to move arm at all due to pain     When using it 5-8/10 4. WORK OR EXERCISE: "Has there been any recent work or exercise that involved this part of the body?"     He swims 5 days a week 5. CAUSE: "What do you think is causing the shoulder pain?"     unknown 6. OTHER SYMPTOMS: "Do you have any other symptoms?" (e.g., neck pain, swelling, rash, fever, numbness, weakness)     Denies  7. PREGNANCY: "Is there any chance you are pregnant?" "When was your last menstrual period?"     na  Protocols used: Shoulder Pain-A-AH

## 2023-07-07 NOTE — Progress Notes (Signed)
 [name] [address] [place] [address] Phone: [phone] Subjective:   Thomas Hubbard, am serving as a scribe for Dr. Antoine Primas.  I'm seeing this patient by the request  of:  McGowen, Maryjean Morn, MD  CC: Left shoulder pain  [id number]  Thomas Hubbard is a 63 y.o. male coming in with complaint of L shoulder pain for past 3 weeks. Seen for R shoulder pain in 2020. Surgery in 2018 for L shoulder. Patient states that his pain is over anterior aspect that radiates down his arm. Patient swims and pain occurs with pulling against water. IR ROM is less. Patient is no longer doing butterfly. Pain with freestyle. Denies any numbness or tingling. Has some tingling in L pinky finger.       Past Medical History:  Diagnosis Date   Adenomatous colon polyp 2011; 03/2015   2011, 2016, and 11/2020->recall 43yrs   Basal cell carcinoma 04/2017   L scapula   Cholelithiasis without cholecystitis    incidentally noted on 2022 pre-ablation cardiac/thoracic MRA   Diverticulosis    Extensor tenosynovitis of right wrist 06/2017   Dr. Katrinka Blazing.  GREAT response to steroid injection.   Hyperlipidemia 2012   TLC helpful initially, but eventually started atorva 03/2019 and had good response.   Left shoulder pain 02/2016   ? calcific bursitis: Dr. Patsi Sears NSAIDs.  Subacromial bursitis/impingement syndrome dx'd at f/u with Dr. Clinton Gallant injection given.  No improvement: MRI showed small insertional RC tear and adhesive capsulitis--steroid injection done again, PT and steroid injections helping as of 06/22/16.   Low back pain    PAF (paroxysmal atrial fibrillation) (HCC) 02/2020   CHADSVASC score= 0.  Dr. Leonia Corona, sports med cardiology specialist at [facility] dx'd pt with Athelete heart syndrome.  Feb 2022 EP MD eval, eventual final dx a-fib and flutter (>a-fib burden than flutter).Ablation 03/2021.  Eliquis d/c'd 10/11/21.   Prediabetes 02/15/2016   HbA1c 6.1%.  A1c  6.3% 03/2017.  TLC-->A1c hanging around 6.1-6.3 as of 06/2019 (on no med).   Right shoulder pain    subacromial bursitis and AC joint arth: steroid inj's by Dr. Katrinka Blazing.  Adhesive capsulitis 09/2018.   TFCC (triangular fibrocartilage complex) tear    degenerative, right   Past Surgical History:  Procedure Laterality Date   BASAL CELL CARCINOMA EXCISION     CARDIAC MRI  07/09/2020   ALL NORMAL excep small focal region of scarring in basal inferolateral wall, pattern nonspecific.   COLONOSCOPY W/ POLYPECTOMY  10/2009; 03/2015   2011 had 1 polyp, 2016 had 2 polyps. 11/2020 3 polyps: recall 5 yrs   ELECTROPHYSIOLOGIC STUDY  03/09/2021   [facility]   EP ablasion  03/09/2021   Daubert, Fredderick Erb, MD   LUMBAR DISC SURGERY  03/2010   L4-5   SHOULDER SURGERY Left 07/2016   Lysis of adhesions (calcific bursitis), bone spur removal, distal clavicle excision.   skin cancer removal     TRANSTHORACIC ECHOCARDIOGRAM  03/04/2020   In the setting of new dx a-fib: EF 45-50%, mild dilat LV, mod dilat LA, global LV hypokin.  10/2021 mild LVH o/w normal->eliquis d/c'd   Social History   Socioeconomic History   Marital status: Married    Spouse name: Not on file   Number of children: 3   Years of education: Not on file   Highest education level: Master's degree (e.g., MA, MS, MEng, MEd, MSW, MBA)  Occupational History   OccupationSports administrator  Employer: [employer]  Tobacco Use   Smoking status: Never   Smokeless tobacco: Never  Vaping Use   Vaping status: Never Used  Substance and Sexual Activity   Alcohol use: Yes    Alcohol/week: 2.0 standard drinks of alcohol    Types: 2 Glasses of wine per week   Drug use: No   Sexual activity: Not on file  Other Topics Concern   Not on file  Social History Narrative   Married, 3 grown children.   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    No T/A/Ds.      Competetive swimmer for many years, still swims as his primary form of exercise.      Originally from [place] Herbal[place]   Social Drivers of Health   Financial Resource Strain: Low Risk  (06/12/2021)   Overall Financial Resource Strain (CARDIA)    Difficulty of Paying Living Expenses: Not hard at all  Food Insecurity: No Food Insecurity (09/19/2022)   Hunger Vital Sign    Worried About Running Out of Food in the Last Year: Never true    Ran Out of Food in the Last Year: Never true  Transportation Needs: No Transportation Needs (09/19/2022)   PRAPARE - Administrator, Civil Service (Medical): No    Lack of Transportation (Non-Medical): No  Physical Activity: Sufficiently Active (09/19/2022)   Exercise Vital Sign    Days of Exercise per Week: 6 days    Minutes of Exercise per Session: 80 min  Stress: No Stress Concern Present (09/19/2022)   [employer] of Occupational Health - Occupational Stress Questionnaire    Feeling of Stress : Only a little  Social Connections: Socially Integrated (09/19/2022)   Social Connection and Isolation Panel [NHANES]    Frequency of Communication with Friends and Family: Three times a week    Frequency of Social Gatherings with Friends and Family: Twice a week    Attends Religious Services: More than 4 times per year    Active Member of [employer] or Organizations: Yes    Attends Engineer, structural: More than 4 times per year    Marital Status: Married   Allergies  Allergen Reactions   Poison Ivy Extract Itching and Rash   Family History  Problem Relation Age of Onset   Colon cancer Mother 64   Hyperlipidemia Father    Basal cell carcinoma Father 76   Basal cell carcinoma Maternal Uncle    Ovarian cancer Paternal Aunt    Ovarian cancer Paternal Aunt    Breast cancer Maternal Grandmother 50 - 75   Cancer Paternal Grandfather        eye cancer   Brain cancer Cousin        paternal first  cousin   Breast cancer Cousin 21 - 58       paternal first cousin   Uterine cancer Cousin 85       paternal first cousin    Current Outpatient Medications (Endocrine & Metabolic):    metFORMIN (GLUCOPHAGE) 500 MG tablet, TAKE 1 TABLET BY MOUTH TWICE A DAY  Current Outpatient Medications (Cardiovascular):    atorvastatin (LIPITOR) 20 MG tablet, Take 1 tablet (20 mg total) by mouth daily.     Current Outpatient Medications (Other):    Misc Natural Products (TART CHERRY ADVANCED PO), Take by mouth daily.    Turmeric 500 MG  CAPS, Take by mouth daily.    Reviewed prior external information including notes and imaging from  primary care provider As well as notes that were available from care everywhere and other healthcare systems.  Past medical history, social, surgical and family history all reviewed in electronic medical record.  No pertanent information unless stated regarding to the chief complaint.   Review of Systems:  No headache, visual changes, nausea, vomiting, diarrhea, constipation, dizziness, abdominal pain, skin rash, fevers, chills, night sweats, weight loss, swollen lymph nodes, body aches, joint swelling, chest pain, shortness of breath, mood changes. POSITIVE muscle aches  Objective  There were no vitals taken for this visit.   General: No apparent distress alert and oriented x3 mood and affect normal, dressed appropriately.  HEENT: Pupils equal, extraocular movements intact  Respiratory: Patient's speak in full sentences and does not appear short of breath  Cardiovascular: No lower extremity edema, non tender, no erythema  Left shoulder exam shows patient does have some tenderness to palpation noted over the shoulder but very minimal.  Limited muscular skeletal ultrasound was performed and interpreted by Antoine Primas, M Limited ultrasound shows that there is some hypoechoic changes of the tendon noted but no true tear noted. Impression: Tendinitis  [id number]; 15  additional minutes spent for Therapeutic exercises as stated in above notes.  This included exercises focusing on stretching, strengthening, with significant focus on eccentric aspects.   Long term goals include an improvement in range of motion, strength, endurance as well as avoiding reinjury. Patient's frequency would include in 1-2 times a day, 3-5 times a week for a duration of 6-12 weeks.  Shoulder Exercises that included:  Basic scapular stabilization to include adduction and depression of scapula Scaption, focusing on proper movement and good control Internal and External rotation utilizing a theraband, with elbow tucked at side entire time Rows with theraband  Proper technique shown and discussed handout in great detail with ATC.  All questions were discussed and answered.      Impression and Recommendations:    The above documentation has been reviewed and is accurate and complete Judi Saa, DO

## 2023-07-10 ENCOUNTER — Encounter: Payer: Self-pay | Admitting: Family Medicine

## 2023-07-10 ENCOUNTER — Ambulatory Visit: Admitting: Family Medicine

## 2023-07-10 ENCOUNTER — Ambulatory Visit (INDEPENDENT_AMBULATORY_CARE_PROVIDER_SITE_OTHER)

## 2023-07-10 ENCOUNTER — Other Ambulatory Visit: Payer: Self-pay

## 2023-07-10 VITALS — BP 118/78 | HR 54 | Ht 75.5 in | Wt 214.0 lb

## 2023-07-10 DIAGNOSIS — M25512 Pain in left shoulder: Secondary | ICD-10-CM

## 2023-07-10 MED ORDER — MELOXICAM 15 MG PO TABS: 15.0000 mg | ORAL_TABLET | Freq: Every day | ORAL | 0 refills | Status: DC

## 2023-07-10 NOTE — Assessment & Plan Note (Signed)
 Patient previously has had moderate subacromial bursitis and impingement.  Did have surgical intervention previously secondary to the shoulder.  At this point seems to be a little different and on ultrasound today seems to be more of a tendinitis.  I discussed oral anti-inflammatories, icing regimen, discussed range of motion exercises.  Follow-up again in 6 to 8 weeks.  Worsening pain consider formal physical therapy and injections.

## 2023-07-10 NOTE — Patient Instructions (Addendum)
 Xray today Meloxicam  Ice Exercises Increase tart cherry to 2400mg  at night See me again in 8 weeks

## 2023-07-18 ENCOUNTER — Encounter: Payer: Self-pay | Admitting: Family Medicine

## 2023-08-06 ENCOUNTER — Other Ambulatory Visit: Payer: Self-pay | Admitting: Family Medicine

## 2023-08-16 ENCOUNTER — Ambulatory Visit: Admitting: Family Medicine

## 2023-09-01 NOTE — Progress Notes (Deleted)
 [facility] [address] [place] [address] Phone: [phone] Subjective:    I'm seeing this patient by the request  of:  McGowen, Minetta Aly, MD  CC:   [id number]  07/10/2023 Patient previously has had moderate subacromial bursitis and impingement. Did have surgical intervention previously secondary to the shoulder. At this point seems to be a little different and on ultrasound today seems to be more of a tendinitis. I discussed oral anti-inflammatories, icing regimen, discussed range of motion exercises. Follow-up again in 6 to 8 weeks. Worsening pain consider formal physical therapy and injections.   Update 09/04/2023 Thomas Hubbard is a 63 y.o. male coming in with complaint of L shoulder pain. Patient states       Past Medical History:  Diagnosis Date   Adenomatous colon polyp 2011; 03/2015   2011, 2016, and 11/2020->recall 76yrs   Basal cell carcinoma 04/2017   L scapula   Cholelithiasis without cholecystitis    incidentally noted on 2022 pre-ablation cardiac/thoracic MRA   Diverticulosis    Extensor tenosynovitis of right wrist 06/2017   Dr. Felipe Horton.  GREAT response to steroid injection.   Hyperlipidemia 2012   TLC helpful initially, but eventually started atorva 03/2019 and had good response.   Left shoulder pain 02/2016   ? calcific bursitis: Dr. Hinton Luis NSAIDs.  Subacromial bursitis/impingement syndrome dx'd at f/u with Dr. Bethene Brought injection given.  No improvement: MRI showed small insertional RC tear and adhesive capsulitis--steroid injection done again, PT and steroid injections helping as of 06/22/16.   Low back pain    PAF (paroxysmal atrial fibrillation) (HCC) 02/2020   CHADSVASC score= 0.  Dr. Kontos, sports med cardiology specialist at [facility] dx'd pt with Athelete heart syndrome.  Feb 2022 EP MD eval, eventual final dx a-fib and flutter (>a-fib burden than flutter).Ablation 03/2021.  Eliquis d/c'd 10/11/21.   Prediabetes  02/15/2016   HbA1c 6.1%.  A1c 6.3% 03/2017.  TLC-->A1c hanging around 6.1-6.3 as of 06/2019 (on no med).   Right shoulder pain    subacromial bursitis and AC joint arth: steroid inj's by Dr. Felipe Horton.  Adhesive capsulitis 09/2018.   TFCC (triangular fibrocartilage complex) tear    degenerative, right   Past Surgical History:  Procedure Laterality Date   BASAL CELL CARCINOMA EXCISION     CARDIAC MRI  07/09/2020   ALL NORMAL excep small focal region of scarring in basal inferolateral wall, pattern nonspecific.   COLONOSCOPY W/ POLYPECTOMY  10/2009; 03/2015   2011 had 1 polyp, 2016 had 2 polyps. 11/2020 3 polyps: recall 5 yrs   ELECTROPHYSIOLOGIC STUDY  03/09/2021   [facility]   EP ablasion  03/09/2021   Daubert, Aundra Bloodgood, MD   LUMBAR DISC SURGERY  03/2010   L4-5   SHOULDER SURGERY Left 07/2016   Lysis of adhesions (calcific bursitis), bone spur removal, distal clavicle excision.   skin cancer removal     TRANSTHORACIC ECHOCARDIOGRAM  03/04/2020   In the setting of new dx a-fib: EF 45-50%, mild dilat LV, mod dilat LA, global LV hypokin.  10/2021 mild LVH o/w normal->eliquis d/c'd   Social History   Socioeconomic History   Marital status: Married    Spouse name: Not on file   Number of children: 3   Years of education: Not on file   Highest education level: Master's degree (e.g., MA, MS, MEng, MEd, MSW, MBA)  Occupational History   OccupationHealth and safety inspector: [employer]  Tobacco Use  Smoking status: Never   Smokeless tobacco: Never  Vaping Use   Vaping status: Never Used  Substance and Sexual Activity   Alcohol use: Yes    Alcohol/week: 2.0 standard drinks of alcohol    Types: 2 Glasses of wine per week   Drug use: No   Sexual activity: Not on file  Other Topics Concern   Not on file  Social History Narrative   Married, 3 grown children.   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   No T/A/Ds.      Competetive swimmer for many years, still swims as his primary form of exercise.      Originally from [place] Herbal[place]   Social Drivers of Health   Financial Resource Strain: Low Risk  (06/12/2021)   Overall Financial Resource Strain (CARDIA)    Difficulty of Paying Living Expenses: Not hard at all  Food Insecurity: No Food Insecurity (09/19/2022)   Hunger Vital Sign    Worried About Running Out of Food in the Last Year: Never true    Ran Out of Food in the Last Year: Never true  Transportation Needs: No Transportation Needs (09/19/2022)   PRAPARE - Administrator, Civil Service (Medical): No    Lack of Transportation (Non-Medical): No  Physical Activity: Sufficiently Active (09/19/2022)   Exercise Vital Sign    Days of Exercise per Week: 6 days    Minutes of Exercise per Session: 80 min  Stress: No Stress Concern Present (09/19/2022)   [employer] of Occupational Health - Occupational Stress Questionnaire    Feeling of Stress : Only a little  Social Connections: Socially Integrated (09/19/2022)   Social Connection and Isolation Panel [NHANES]    Frequency of Communication with Friends and Family: Three times a week    Frequency of Social Gatherings with Friends and Family: Twice a week    Attends Religious Services: More than 4 times per year    Active Member of [employer] or Organizations: Yes    Attends Engineer, structural: More than 4 times per year    Marital Status: Married   Allergies  Allergen Reactions   Poison Ivy Extract Itching and Rash   Family History  Problem Relation Age of Onset   Colon cancer Mother 38   Hyperlipidemia Father    Basal cell carcinoma Father 17   Basal cell carcinoma Maternal Uncle    Ovarian cancer Paternal Aunt    Ovarian cancer Paternal Aunt    Breast cancer Maternal Grandmother 53 - 31   Cancer Paternal Grandfather        eye cancer   Brain  cancer Cousin        paternal first cousin   Breast cancer Cousin 32 - 69       paternal first cousin   Uterine cancer Cousin 50       paternal first cousin    Current Outpatient Medications (Endocrine & Metabolic):    metFORMIN  (GLUCOPHAGE ) 500 MG tablet, TAKE 1 TABLET BY MOUTH TWICE A DAY  Current Outpatient Medications (Cardiovascular):    atorvastatin  (LIPITOR) 20 MG tablet, Take 1 tablet (20 mg total) by mouth daily.   Current Outpatient Medications (Analgesics):    meloxicam  (MOBIC ) 15 MG tablet, TAKE 1 TABLET (15 MG TOTAL) BY MOUTH DAILY.   Current Outpatient Medications (Other):    Misc Natural Products (  TART CHERRY ADVANCED PO), Take by mouth daily.    Turmeric 500 MG CAPS, Take by mouth daily.    Reviewed prior external information including notes and imaging from  primary care provider As well as notes that were available from care everywhere and other healthcare systems.  Past medical history, social, surgical and family history all reviewed in electronic medical record.  No pertanent information unless stated regarding to the chief complaint.   Review of Systems:  No headache, visual changes, nausea, vomiting, diarrhea, constipation, dizziness, abdominal pain, skin rash, fevers, chills, night sweats, weight loss, swollen lymph nodes, body aches, joint swelling, chest pain, shortness of breath, mood changes. POSITIVE muscle aches  Objective  There were no vitals taken for this visit.   General: No apparent distress alert and oriented x3 mood and affect normal, dressed appropriately.  HEENT: Pupils equal, extraocular movements intact  Respiratory: Patient's speak in full sentences and does not appear short of breath  Cardiovascular: No lower extremity edema, non tender, no erythema      Impression and Recommendations:

## 2023-09-04 ENCOUNTER — Ambulatory Visit: Admitting: Family Medicine

## 2023-09-25 ENCOUNTER — Ambulatory Visit: Payer: 59 | Admitting: Family Medicine

## 2023-09-25 ENCOUNTER — Ambulatory Visit: Payer: Self-pay | Admitting: Family Medicine

## 2023-09-25 ENCOUNTER — Encounter: Payer: Self-pay | Admitting: Family Medicine

## 2023-09-25 VITALS — BP 117/74 | HR 58 | Wt 210.0 lb

## 2023-09-25 DIAGNOSIS — E7800 Pure hypercholesterolemia, unspecified: Secondary | ICD-10-CM

## 2023-09-25 DIAGNOSIS — R7303 Prediabetes: Secondary | ICD-10-CM | POA: Diagnosis not present

## 2023-09-25 LAB — POCT GLYCOSYLATED HEMOGLOBIN (HGB A1C)
HbA1c POC (<> result, manual entry): 6.2 % (ref 4.0–5.6)
HbA1c, POC (controlled diabetic range): 6.2 % (ref 0.0–7.0)
HbA1c, POC (prediabetic range): 6.2 % (ref 5.7–6.4)
Hemoglobin A1C: 6.2 % — AB (ref 4.0–5.6)

## 2023-09-25 LAB — COMPREHENSIVE METABOLIC PANEL WITH GFR
ALT: 14 U/L (ref 0–53)
AST: 15 U/L (ref 0–37)
Albumin: 4.4 g/dL (ref 3.5–5.2)
Alkaline Phosphatase: 64 U/L (ref 39–117)
BUN: 20 mg/dL (ref 6–23)
CO2: 29 meq/L (ref 19–32)
Calcium: 9.8 mg/dL (ref 8.4–10.5)
Chloride: 105 meq/L (ref 96–112)
Creatinine, Ser: 1.17 mg/dL (ref 0.40–1.50)
GFR: 65.93 mL/min (ref >60.00)
Glucose, Bld: 99 mg/dL (ref 70–99)
Potassium: 4.8 meq/L (ref 3.5–5.1)
Sodium: 142 meq/L (ref 135–145)
Total Bilirubin: 0.7 mg/dL (ref 0.2–1.2)
Total Protein: 7.2 g/dL (ref 6.0–8.3)

## 2023-09-25 LAB — LIPID PANEL
Cholesterol: 168 mg/dL (ref 0–200)
HDL: 52.2 mg/dL (ref >39.00)
LDL Cholesterol: 96 mg/dL (ref 0–99)
NonHDL: 116.06
Total CHOL/HDL Ratio: 3
Triglycerides: 100 mg/dL (ref 0.0–149.0)
VLDL: 20 mg/dL (ref 0.0–40.0)

## 2023-09-25 MED ORDER — SILDENAFIL CITRATE 100 MG PO TABS: 50.0000 mg | ORAL_TABLET | Freq: Every day | ORAL | 11 refills | Status: AC | PRN

## 2023-09-25 MED ORDER — ATORVASTATIN CALCIUM 40 MG PO TABS: 40.0000 mg | ORAL_TABLET | Freq: Every day | ORAL | 2 refills | Status: DC

## 2023-09-25 NOTE — Progress Notes (Signed)
 OFFICE VISIT  09/25/2023  CC:  Chief Complaint  Patient presents with   Diabetes    Patient is a 63 y.o. male who presents for 71-month follow-up prediabetes and hypercholesterolemia. A/P as of last visit: 1 Prediabetes, A1c up a little bit to 6.2% today.  will work on diet.  Exercise is already excellent. Continue metformin  500 mg twice a day.   #2 hypercholesterolemia, continue atorvastatin  20 mg a day. Lipid panel and hepatic panel today.  INTERIM HX: Thomas Hubbard feels well. He says his diet has not been as ideal as usual.  He maintains a great workout schedule with swimming.  He has no acute concerns.  He is fasting today.  Past Medical History:  Diagnosis Date   Adenomatous colon polyp 2011; 03/2015   2011, 2016, and 11/2020->recall 28yrs   Basal cell carcinoma 04/2017   L scapula   Cholelithiasis without cholecystitis    incidentally noted on 2022 pre-ablation cardiac/thoracic MRA   Diverticulosis    Extensor tenosynovitis of right wrist 06/2017   Dr. Claudene.  GREAT response to steroid injection.   Hyperlipidemia 2012   TLC helpful initially, but eventually started atorva 03/2019 and had good response.   Left shoulder pain 02/2016   ? calcific bursitis: Dr. Charlies NSAIDs.  Subacromial bursitis/impingement syndrome dx'd at f/u with Dr. Johnsie injection given.  No improvement: MRI showed small insertional RC tear and adhesive capsulitis--steroid injection done again, PT and steroid injections helping as of 06/22/16.   Low back pain    PAF (paroxysmal atrial fibrillation) (HCC) 02/2020   CHADSVASC score= 0.  Dr. Kontos, sports med cardiology specialist at [facility] dx'd pt with Athelete heart syndrome.  Feb 2022 EP MD eval, eventual final dx a-fib and flutter (>a-fib burden than flutter).Ablation 03/2021.  Eliquis d/c'd 10/11/21.   Prediabetes 02/15/2016   HbA1c 6.1%.  A1c 6.3% 03/2017.  TLC-->A1c hanging around 6.1-6.3 as of 06/2019 (on no med).   Right shoulder pain     subacromial bursitis and AC joint arth: steroid inj's by Dr. Claudene.  Adhesive capsulitis 09/2018.   TFCC (triangular fibrocartilage complex) tear    degenerative, right    Past Surgical History:  Procedure Laterality Date   BASAL CELL CARCINOMA EXCISION     CARDIAC MRI  07/09/2020   ALL NORMAL excep small focal region of scarring in basal inferolateral wall, pattern nonspecific.   COLONOSCOPY W/ POLYPECTOMY  10/2009; 03/2015   2011 had 1 polyp, 2016 had 2 polyps. 11/2020 3 polyps: recall 5 yrs   ELECTROPHYSIOLOGIC STUDY  03/09/2021   [facility]   EP ablasion  03/09/2021   Daubert, Lynwood Dover, MD   LUMBAR DISC SURGERY  03/2010   L4-5   SHOULDER SURGERY Left 07/2016   Lysis of adhesions (calcific bursitis), bone spur removal, distal clavicle excision.   skin cancer removal     TRANSTHORACIC ECHOCARDIOGRAM  03/04/2020   In the setting of new dx a-fib: EF 45-50%, mild dilat LV, mod dilat LA, global LV hypokin.  10/2021 mild LVH o/w normal->eliquis d/c'd    Outpatient Medications Prior to Visit  Medication Sig Dispense Refill   atorvastatin  (LIPITOR) 20 MG tablet Take 1 tablet (20 mg total) by mouth daily. 90 tablet 3   meloxicam  (MOBIC ) 15 MG tablet TAKE 1 TABLET (15 MG TOTAL) BY MOUTH DAILY. 30 tablet 0   metFORMIN  (GLUCOPHAGE ) 500 MG tablet TAKE 1 TABLET BY MOUTH TWICE A DAY 180 tablet 3   Misc Natural Products (TART CHERRY ADVANCED PO) Take  by mouth daily.      Turmeric 500 MG CAPS Take by mouth daily.      No facility-administered medications prior to visit.    Allergies  Allergen Reactions   Poison Ivy Extract Itching and Rash    Review of Systems As per HPI  PE:    09/25/2023    8:29 AM 07/10/2023   10:18 AM 03/27/2023    8:01 AM  Vitals with BMI  Height  6' 3.5 6' 3.5  Weight 210 lbs 214 lbs 216 lbs 10 oz  BMI  26.39 26.71  Systolic 117 118 863  Diastolic 74 78 77  Pulse 58 54 52     Physical Exam  Gen: Alert, well appearing.  Patient is oriented to person,  place, time, and situation. AFFECT: pleasant, lucid thought and speech. CV: RRR, no m/r/g.   LUNGS: CTA bilat, nonlabored resps, good aeration in all lung fields. EXT: no clubbing or cyanosis.  no edema.    LABS:  Last CBC Lab Results  Component Value Date   WBC 5.5 03/27/2023   HGB 14.6 03/27/2023   HCT 44.6 03/27/2023   MCV 98.0 03/27/2023   RDW 13.8 03/27/2023   PLT 226.0 03/27/2023   Last metabolic panel Lab Results  Component Value Date   GLUCOSE 121 (H) 03/27/2023   NA 139 03/27/2023   K 4.8 03/27/2023   CL 106 03/27/2023   CO2 29 03/27/2023   BUN 20 03/27/2023   CREATININE 1.15 03/27/2023   GFR 67.54 03/27/2023   CALCIUM  9.2 03/27/2023   PROT 6.8 03/27/2023   ALBUMIN 4.0 03/27/2023   BILITOT 0.4 03/27/2023   ALKPHOS 67 03/27/2023   AST 13 03/27/2023   ALT 14 03/27/2023   Last lipids Lab Results  Component Value Date   CHOL 157 03/27/2023   HDL 54.20 03/27/2023   LDLCALC 88 03/27/2023   TRIG 72.0 03/27/2023   CHOLHDL 3 03/27/2023   Last hemoglobin A1c Lab Results  Component Value Date   HGBA1C 6.2 (A) 03/27/2023   HGBA1C 6.2 03/27/2023   HGBA1C 6.2 03/27/2023   HGBA1C 6.2 03/27/2023   Last thyroid  functions Lab Results  Component Value Date   TSH 2.30 03/27/2023   Lab Results  Component Value Date   PSA 0.98 03/27/2023   PSA 1.14 03/22/2022   PSA 1.09 03/17/2021   IMPRESSION AND PLAN:  #1 prediabetes.  Stable on metformin  500 mg twice daily. POC hba1c is 6.2% again today. He is going to make some gradual improvements in his diet.  Great exercise habits.  #2 hypercholesterolemia.  Doing well on atorvastatin  20 mg a day. LDL was 88 about 6 months ago. Lipid panel and hepatic panel today.  #3 PAF.  He has had an ablation.  He is doing well. He reports that his cardiologist will be starting him on Eliquis after he turns 64 because he will meet the CHA2DS2-VASc 2 criteria at that time. He will also be getting a cardiac MRI in July.  An  After Visit Summary was printed and given to the patient.  FOLLOW UP: No follow-ups on file.  Signed:  Gerlene Hockey, MD           09/25/2023

## 2023-09-28 NOTE — Telephone Encounter (Signed)
 40 mg atorvastatin  is middle dosing (80mg  is high dosing)

## 2023-11-18 ENCOUNTER — Other Ambulatory Visit: Payer: Self-pay | Admitting: Family Medicine

## 2023-11-30 ENCOUNTER — Other Ambulatory Visit: Payer: Self-pay | Admitting: Family Medicine

## 2024-04-01 NOTE — Progress Notes (Unsigned)
 "     Office Note 04/02/2024  CC:  Chief Complaint  Patient presents with   Annual Exam    Pt is fasting   Patient is a 63 y.o. male who is here for annual health maintenance exam and 6 mo f/u hyperchol and prediabetes. A/P as of last visit: #1 prediabetes.  Stable on metformin  500 mg twice daily. POC hba1c is 6.2% again today. He is going to make some gradual improvements in his diet.  Great exercise habits.   #2 hypercholesterolemia.  Doing well on atorvastatin  20 mg a day. LDL was 88 about 6 months ago. Lipid panel and hepatic panel today.   #3 PAF.  He has had an ablation.  He is doing well. He reports that his cardiologist will be starting him on Eliquis after he turns 64 because he will meet the CHA2DS2-VASc 2 criteria at that time. He will also be getting a cardiac MRI in July.  INTERIM HX: Feeling well. Swimming a lot.  He'll retired around March next year!  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  Past Medical History:  Diagnosis Date   Adenomatous colon polyp 2011; 03/2015   2011, 2016, and 11/2020->recall 70yrs   Arthritis See medical notes   Basal cell carcinoma 04/2017   L scapula   Cholelithiasis without cholecystitis    incidentally noted on 2022 pre-ablation cardiac/thoracic MRA   Diverticulosis    Extensor tenosynovitis of right wrist 06/2017   Dr. Claudene.  GREAT response to steroid injection.   Hyperlipidemia 2012   TLC helpful initially, but eventually started atorva 03/2019 and had good response.   Left shoulder pain 02/2016   ? calcific bursitis: Dr. Charlies NSAIDs.  Subacromial bursitis/impingement syndrome dx'd at f/u with Dr. Johnsie injection given.  No improvement: MRI showed small insertional RC tear and adhesive capsulitis--steroid injection done again, PT and steroid injections helping as of 06/22/16.   Low back pain    PAF (paroxysmal atrial fibrillation) (HCC) 02/2020   CHADSVASC score= 0.  Dr. Kontos, sports med cardiology  specialist at [facility] dx'd pt with Athelete heart syndrome.  Feb 2022 EP MD eval, eventual final dx a-fib and flutter (>a-fib burden than flutter).Ablation 03/2021.  Eliquis d/c'd 10/11/21.   Prediabetes 02/15/2016   HbA1c 6.1%.  A1c 6.3% 03/2017.  TLC-->A1c hanging around 6.1-6.3 as of 06/2019 (on no med).   Right shoulder pain    subacromial bursitis and AC joint arth: steroid inj's by Dr. Claudene.  Adhesive capsulitis 09/2018.   TFCC (triangular fibrocartilage complex) tear    degenerative, right    Past Surgical History:  Procedure Laterality Date   BASAL CELL CARCINOMA EXCISION     CARDIAC MRI  07/09/2020   ALL NORMAL excep small focal region of scarring in basal inferolateral wall, pattern nonspecific.   COLONOSCOPY W/ POLYPECTOMY  10/2009; 03/2015   2011 had 1 polyp, 2016 had 2 polyps. 11/2020 3 polyps: recall 5 yrs   ELECTROPHYSIOLOGIC STUDY  03/09/2021   [facility]   EP ablasion  03/09/2021   Daubert, Lynwood Dover, MD   LUMBAR DISC SURGERY  03/2010   L4-5   SHOULDER SURGERY Left 07/2016   Lysis of adhesions (calcific bursitis), bone spur removal, distal clavicle excision.   skin cancer removal     SPINE SURGERY  2011   L4L5 disc   TRANSTHORACIC ECHOCARDIOGRAM  03/04/2020   In the setting of new dx a-fib: EF 45-50%, mild dilat LV, mod dilat LA, global LV hypokin.  10/2021 mild  LVH o/w normal->eliquis d/c'd    Family History  Problem Relation Age of Onset   Colon cancer Mother 21   Cancer Mother    Hyperlipidemia Father    Basal cell carcinoma Father 51   Diabetes Father    Hearing loss Father    Hearing loss Sister    Basal cell carcinoma Maternal Uncle    Ovarian cancer Paternal Aunt    Ovarian cancer Paternal Aunt    Breast cancer Maternal Grandmother 51 - 57   Cancer Paternal Grandfather        eye cancer   Brain cancer Cousin        paternal first cousin   Breast cancer Cousin 68 - 44       paternal first cousin   Uterine cancer Cousin 9       paternal first cousin     Social History   Socioeconomic History   Marital status: Married    Spouse name: Not on file   Number of children: 3   Years of education: Not on file   Highest education level: Master's degree (e.g., MA, MS, MEng, MEd, MSW, MBA)  Occupational History   Occupation: Journalist, Newspaper: [employer]  Tobacco Use   Smoking status: Never   Smokeless tobacco: Never  Vaping Use   Vaping status: Never Used  Substance and Sexual Activity   Alcohol use: Yes    Alcohol/week: 3.0 standard drinks of alcohol    Types: 3 Glasses of wine per week   Drug use: Never   Sexual activity: Yes    Birth control/protection: Surgical  Other Topics Concern   Not on file  Social History Narrative   Married, 3 grown children.   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   No T/A/Ds.      Competetive swimmer for many years, still swims as his primary form of exercise.      Originally from [place] area, [place]   Social Drivers of Health   Tobacco Use: Low Risk (04/02/2024)   Patient History    Smoking Tobacco Use: Never    Smokeless Tobacco Use: Never    Passive Exposure: Not on file  Financial Resource Strain: Low Risk (03/29/2024)   Overall Financial Resource Strain (CARDIA)    Difficulty of Paying Living Expenses: Not hard at all  Food Insecurity: No Food Insecurity (03/29/2024)   Epic    Worried About Radiation Protection Practitioner of Food in the Last Year: Never true    Ran Out of Food in the Last Year: Never true  Transportation Needs: No Transportation Needs (03/29/2024)   Epic    Lack of Transportation (Medical): No    Lack of Transportation (Non-Medical): No  Physical Activity: Sufficiently Active (03/29/2024)   Exercise Vital Sign    Days of Exercise per Week: 5 days    Minutes of Exercise per Session: 90 min  Stress: No Stress Concern Present (03/29/2024)    [employer] of Occupational Health - Occupational Stress Questionnaire    Feeling of Stress: Not at all  Social Connections: Socially Integrated (03/29/2024)   Social Connection and Isolation Panel    Frequency of Communication with Friends and Family: More than three times a week    Frequency of Social Gatherings with Friends and Family: Twice a week    Attends Religious Services: More than 4 times per  year    Active Member of Clubs or Organizations: Yes    Attends Banker Meetings: More than 4 times per year    Marital Status: Married  Catering Manager Violence: Not on file  Depression (PHQ2-9): Low Risk (04/02/2024)   Depression (PHQ2-9)    PHQ-2 Score: 0  Alcohol Screen: Low Risk (03/29/2024)   Alcohol Screen    Last Alcohol Screening Score (AUDIT): 3  Housing: Low Risk (03/29/2024)   Epic    Unable to Pay for Housing in the Last Year: No    Number of Times Moved in the Last Year: 0    Homeless in the Last Year: No  Utilities: Not on file  Health Literacy: Not on file    Outpatient Medications Prior to Visit  Medication Sig Dispense Refill   meloxicam  (MOBIC ) 15 MG tablet TAKE 1 TABLET (15 MG TOTAL) BY MOUTH DAILY. 30 tablet 0   Misc Natural Products (TART CHERRY ADVANCED PO) Take by mouth daily.      sildenafil  (VIAGRA ) 100 MG tablet Take 0.5-1 tablets (50-100 mg total) by mouth daily as needed for erectile dysfunction. 10 tablet 11   Turmeric 500 MG CAPS Take by mouth daily.      atorvastatin  (LIPITOR) 40 MG tablet TAKE 1 TABLET BY MOUTH EVERY DAY 90 tablet 0   metFORMIN  (GLUCOPHAGE ) 500 MG tablet TAKE 1 TABLET BY MOUTH TWICE A DAY 180 tablet 3   No facility-administered medications prior to visit.    Allergies[1]  Review of Systems  Constitutional:  Negative for appetite change, chills, fatigue and fever.  HENT:  Negative for congestion, dental problem, ear pain and sore throat.   Eyes:  Negative for discharge, redness and visual disturbance.   Respiratory:  Negative for cough, chest tightness, shortness of breath and wheezing.   Cardiovascular:  Negative for chest pain, palpitations and leg swelling.  Gastrointestinal:  Negative for abdominal pain, blood in stool, diarrhea, nausea and vomiting.  Genitourinary:  Negative for difficulty urinating, dysuria, flank pain, frequency, hematuria and urgency.  Musculoskeletal:  Negative for arthralgias, back pain, joint swelling, myalgias and neck stiffness.  Skin:  Negative for pallor and rash.  Neurological:  Negative for dizziness, speech difficulty, weakness and headaches.  Hematological:  Negative for adenopathy. Does not bruise/bleed easily.  Psychiatric/Behavioral:  Negative for confusion and sleep disturbance. The patient is not nervous/anxious.     PE;    04/02/2024    8:16 AM 09/25/2023    8:29 AM 07/10/2023   10:18 AM  Vitals with BMI  Height 6' 3.5  6' 3.5  Weight 217 lbs 6 oz 210 lbs 214 lbs  BMI 26.81  26.39  Systolic 108 117 881  Diastolic 67 74 78  Pulse 67 58 54   Gen: Alert, well appearing.  Patient is oriented to person, place, time, and situation. AFFECT: pleasant, lucid thought and speech. ENT: Ears: EACs clear, normal epithelium.  TMs with good light reflex and landmarks bilaterally.  Eyes: no injection, icteris, swelling, or exudate.  EOMI, PERRLA. Nose: no drainage or turbinate edema/swelling.  No injection or focal lesion.  Mouth: lips without lesion/swelling.  Oral mucosa pink and moist.  Dentition intact and without obvious caries or gingival swelling.  Oropharynx without erythema, exudate, or swelling.  Neck: supple/nontender.  No LAD, mass, or TM.  Carotid pulses 2+ bilaterally, without bruits. CV: RRR, no m/r/g.   LUNGS: CTA bilat, nonlabored resps, good aeration in all lung fields. ABD: soft, NT, ND, BS  normal.  No hepatospenomegaly or mass.  No bruits. EXT: no clubbing, cyanosis, or edema.  Musculoskeletal: no joint swelling, erythema, warmth, or  tenderness.  ROM of all joints intact. Skin - no sores or suspicious lesions or rashes or color changes  Pertinent labs:  Lab Results  Component Value Date   TSH 2.30 03/27/2023   Lab Results  Component Value Date   WBC 5.5 03/27/2023   HGB 14.6 03/27/2023   HCT 44.6 03/27/2023   MCV 98.0 03/27/2023   PLT 226.0 03/27/2023   Lab Results  Component Value Date   CREATININE 1.17 09/25/2023   BUN 20 09/25/2023   NA 142 09/25/2023   K 4.8 09/25/2023   CL 105 09/25/2023   CO2 29 09/25/2023   Lab Results  Component Value Date   ALT 14 09/25/2023   AST 15 09/25/2023   ALKPHOS 64 09/25/2023   BILITOT 0.7 09/25/2023   Lab Results  Component Value Date   CHOL 168 09/25/2023   Lab Results  Component Value Date   HDL 52.20 09/25/2023   Lab Results  Component Value Date   LDLCALC 96 09/25/2023   Lab Results  Component Value Date   TRIG 100.0 09/25/2023   Lab Results  Component Value Date   CHOLHDL 3 09/25/2023   Lab Results  Component Value Date   PSA 0.98 03/27/2023   PSA 1.14 03/22/2022   PSA 1.09 03/17/2021   Lab Results  Component Value Date   HGBA1C 6.2 (A) 09/25/2023   HGBA1C 6.2 09/25/2023   HGBA1C 6.2 09/25/2023   HGBA1C 6.2 09/25/2023   ASSESSMENT AND PLAN:   1) Health maintenance exam: Reviewed age and gender appropriate health maintenance issues (prudent diet, regular exercise, health risks of tobacco and excessive alcohol, use of seatbelts, fire alarms in home, use of sunscreen).  Also reviewed age and gender appropriate health screening as well as vaccine recommendations. Vaccines: Prevnar 20-->declined. Labs: fasting HP, Hba1c, PSA. Prostate ca screening: PSA today Colon ca screening: recall 11/2025.  2 prediabetes.  Stable on metformin  500 mg twice daily. Fasting glucose and Hba1c today. Renal fxn today.   3 hypercholesterolemia.  Doing well on atorvastatin  20 mg a day. LDL was 96 about 6 mo ago Lipid panel and hepatic panel today.   #3  PAF.  He has had an ablation.  He is doing well. He reports that his cardiologist will be starting him on Eliquis after he turns 64 because he will meet the CHA2DS2-VASc 2 criteria at that time.  An After Visit Summary was printed and given to the patient.  FOLLOW UP:  Return in about 6 months (around 10/01/2024) for routine chronic illness f/u.  Signed:  Phil Natascha Edmonds, MD           04/02/2024     [1]  Allergies Allergen Reactions   Poison Ivy Extract Itching and Rash   "

## 2024-04-02 ENCOUNTER — Encounter: Payer: Self-pay | Admitting: Family Medicine

## 2024-04-02 ENCOUNTER — Ambulatory Visit: Admitting: Family Medicine

## 2024-04-02 ENCOUNTER — Ambulatory Visit: Payer: Self-pay | Admitting: Family Medicine

## 2024-04-02 VITALS — BP 108/67 | HR 67 | Temp 97.0°F | Ht 75.5 in | Wt 217.4 lb

## 2024-04-02 DIAGNOSIS — Z125 Encounter for screening for malignant neoplasm of prostate: Secondary | ICD-10-CM | POA: Diagnosis not present

## 2024-04-02 DIAGNOSIS — R7303 Prediabetes: Secondary | ICD-10-CM | POA: Diagnosis not present

## 2024-04-02 DIAGNOSIS — E7800 Pure hypercholesterolemia, unspecified: Secondary | ICD-10-CM | POA: Diagnosis not present

## 2024-04-02 DIAGNOSIS — Z0000 Encounter for general adult medical examination without abnormal findings: Secondary | ICD-10-CM

## 2024-04-02 DIAGNOSIS — Z23 Encounter for immunization: Secondary | ICD-10-CM | POA: Diagnosis not present

## 2024-04-02 LAB — CBC WITH DIFFERENTIAL/PLATELET
Basophils Absolute: 0 K/uL (ref 0.0–0.1)
Basophils Relative: 0.4 % (ref 0.0–3.0)
Eosinophils Absolute: 0.1 K/uL (ref 0.0–0.7)
Eosinophils Relative: 1.8 % (ref 0.0–5.0)
HCT: 45.4 % (ref 39.0–52.0)
Hemoglobin: 15 g/dL (ref 13.0–17.0)
Lymphocytes Relative: 19.2 % (ref 12.0–46.0)
Lymphs Abs: 1.3 K/uL (ref 0.7–4.0)
MCHC: 33.1 g/dL (ref 30.0–36.0)
MCV: 95.9 fl (ref 78.0–100.0)
Monocytes Absolute: 0.5 K/uL (ref 0.1–1.0)
Monocytes Relative: 7.4 % (ref 3.0–12.0)
Neutro Abs: 4.8 K/uL (ref 1.4–7.7)
Neutrophils Relative %: 71.2 % (ref 43.0–77.0)
Platelets: 209 K/uL (ref 150.0–400.0)
RBC: 4.73 Mil/uL (ref 4.22–5.81)
RDW: 13.9 % (ref 11.5–15.5)
WBC: 6.7 K/uL (ref 4.0–10.5)

## 2024-04-02 LAB — LIPID PANEL
Cholesterol: 172 mg/dL (ref 28–200)
HDL: 57 mg/dL
LDL Cholesterol: 99 mg/dL (ref 10–99)
NonHDL: 115.39
Total CHOL/HDL Ratio: 3
Triglycerides: 81 mg/dL (ref 10.0–149.0)
VLDL: 16.2 mg/dL (ref 0.0–40.0)

## 2024-04-02 LAB — COMPREHENSIVE METABOLIC PANEL WITH GFR
ALT: 23 U/L (ref 3–53)
AST: 18 U/L (ref 5–37)
Albumin: 4.4 g/dL (ref 3.5–5.2)
Alkaline Phosphatase: 75 U/L (ref 39–117)
BUN: 20 mg/dL (ref 6–23)
CO2: 29 meq/L (ref 19–32)
Calcium: 9.6 mg/dL (ref 8.4–10.5)
Chloride: 104 meq/L (ref 96–112)
Creatinine, Ser: 1.15 mg/dL (ref 0.40–1.50)
GFR: 67.06 mL/min
Glucose, Bld: 116 mg/dL — ABNORMAL HIGH (ref 70–99)
Potassium: 4.6 meq/L (ref 3.5–5.1)
Sodium: 140 meq/L (ref 135–145)
Total Bilirubin: 0.7 mg/dL (ref 0.2–1.2)
Total Protein: 7.4 g/dL (ref 6.0–8.3)

## 2024-04-02 LAB — PSA: PSA: 1 ng/mL (ref 0.10–4.00)

## 2024-04-02 LAB — TSH: TSH: 2.51 u[IU]/mL (ref 0.35–5.50)

## 2024-04-02 LAB — HEMOGLOBIN A1C: Hgb A1c MFr Bld: 6.8 % — ABNORMAL HIGH (ref 4.6–6.5)

## 2024-04-02 MED ORDER — ATORVASTATIN CALCIUM 40 MG PO TABS: 40.0000 mg | ORAL_TABLET | Freq: Every day | ORAL | 3 refills | Status: AC

## 2024-04-02 MED ORDER — METFORMIN HCL 500 MG PO TABS: ORAL_TABLET | ORAL | 3 refills | Status: DC

## 2024-04-02 MED ORDER — METFORMIN HCL 1000 MG PO TABS: 1000.0000 mg | ORAL_TABLET | Freq: Two times a day (BID) | ORAL | 3 refills | Status: AC

## 2024-04-02 NOTE — Addendum Note (Signed)
 Addended by: FLETA CARE D on: 04/02/2024 08:50 AM   Modules accepted: Orders

## 2024-04-02 NOTE — Patient Instructions (Signed)
 Health Maintenance, Male  Adopting a healthy lifestyle and getting preventive care are important in promoting health and wellness. Ask your health care provider about:  The right schedule for you to have regular tests and exams.  Things you can do on your own to prevent diseases and keep yourself healthy.  What should I know about diet, weight, and exercise?  Eat a healthy diet    Eat a diet that includes plenty of vegetables, fruits, low-fat dairy products, and lean protein.  Do not eat a lot of foods that are high in solid fats, added sugars, or sodium.  Maintain a healthy weight  Body mass index (BMI) is a measurement that can be used to identify possible weight problems. It estimates body fat based on height and weight. Your health care provider can help determine your BMI and help you achieve or maintain a healthy weight.  Get regular exercise  Get regular exercise. This is one of the most important things you can do for your health. Most adults should:  Exercise for at least 150 minutes each week. The exercise should increase your heart rate and make you sweat (moderate-intensity exercise).  Do strengthening exercises at least twice a week. This is in addition to the moderate-intensity exercise.  Spend less time sitting. Even light physical activity can be beneficial.  Watch cholesterol and blood lipids  Have your blood tested for lipids and cholesterol at 64 years of age, then have this test every 5 years.  You may need to have your cholesterol levels checked more often if:  Your lipid or cholesterol levels are high.  You are older than 64 years of age.  You are at high risk for heart disease.  What should I know about cancer screening?  Many types of cancers can be detected early and may often be prevented. Depending on your health history and family history, you may need to have cancer screening at various ages. This may include screening for:  Colorectal cancer.  Prostate cancer.  Skin cancer.  Lung  cancer.  What should I know about heart disease, diabetes, and high blood pressure?  Blood pressure and heart disease  High blood pressure causes heart disease and increases the risk of stroke. This is more likely to develop in people who have high blood pressure readings or are overweight.  Talk with your health care provider about your target blood pressure readings.  Have your blood pressure checked:  Every 3-5 years if you are 24-52 years of age.  Every year if you are 3 years old or older.  If you are between the ages of 60 and 72 and are a current or former smoker, ask your health care provider if you should have a one-time screening for abdominal aortic aneurysm (AAA).  Diabetes  Have regular diabetes screenings. This checks your fasting blood sugar level. Have the screening done:  Once every three years after age 66 if you are at a normal weight and have a low risk for diabetes.  More often and at a younger age if you are overweight or have a high risk for diabetes.  What should I know about preventing infection?  Hepatitis B  If you have a higher risk for hepatitis B, you should be screened for this virus. Talk with your health care provider to find out if you are at risk for hepatitis B infection.  Hepatitis C  Blood testing is recommended for:  Everyone born from [birth year] through 1966.  Anyone  with known risk factors for hepatitis C.  Sexually transmitted infections (STIs)  You should be screened each year for STIs, including gonorrhea and chlamydia, if:  You are sexually active and are younger than 64 years of age.  You are older than 64 years of age and your health care provider tells you that you are at risk for this type of infection.  Your sexual activity has changed since you were last screened, and you are at increased risk for chlamydia or gonorrhea. Ask your health care provider if you are at risk.  Ask your health care provider about whether you are at high risk for HIV. Your health care provider  may recommend a prescription medicine to help prevent HIV infection. If you choose to take medicine to prevent HIV, you should first get tested for HIV. You should then be tested every 3 months for as long as you are taking the medicine.  Follow these instructions at home:  Alcohol use  Do not drink alcohol if your health care provider tells you not to drink.  If you drink alcohol:  Limit how much you have to 0-2 drinks a day.  Know how much alcohol is in your drink. In the [place] one drink equals one 12 oz bottle of beer (355 mL), one 5 oz glass of wine (148 mL), or one 1 oz glass of hard liquor (44 mL).  Lifestyle  Do not use any products that contain nicotine or tobacco. These products include cigarettes, chewing tobacco, and vaping devices, such as e-cigarettes. If you need help quitting, ask your health care provider.  Do not use street drugs.  Do not share needles.  Ask your health care provider for help if you need support or information about quitting drugs.  General instructions  Schedule regular health, dental, and eye exams.  Stay current with your vaccines.  Tell your health care provider if:  You often feel depressed.  You have ever been abused or do not feel safe at home.  Summary  Adopting a healthy lifestyle and getting preventive care are important in promoting health and wellness.  Follow your health care provider's instructions about healthy diet, exercising, and getting tested or screened for diseases.  Follow your health care provider's instructions on monitoring your cholesterol and blood pressure.  This information is not intended to replace advice given to you by your health care provider. Make sure you discuss any questions you have with your health care provider.  Document Revised: 08/10/2020 Document Reviewed: 08/10/2020  Elsevier Patient Education  2024 [employer].

## 2024-10-01 ENCOUNTER — Ambulatory Visit: Admitting: Family Medicine
